# Patient Record
Sex: Female | Born: 1950
Health system: Southern US, Community
[De-identification: ages and names within clinical notes are randomized; demographics above are authoritative.]

## PROBLEM LIST (undated history)

## (undated) DIAGNOSIS — F32A Depression, unspecified: Secondary | ICD-10-CM

## (undated) DIAGNOSIS — R4586 Emotional lability: Secondary | ICD-10-CM

## (undated) DIAGNOSIS — I10 Essential (primary) hypertension: Secondary | ICD-10-CM

## (undated) DIAGNOSIS — L309 Dermatitis, unspecified: Secondary | ICD-10-CM

## (undated) DIAGNOSIS — E789 Disorder of lipoprotein metabolism, unspecified: Secondary | ICD-10-CM

## (undated) DIAGNOSIS — F419 Anxiety disorder, unspecified: Secondary | ICD-10-CM

## (undated) HISTORY — DX: Anxiety disorder, unspecified: F41.9

## (undated) HISTORY — DX: Depression, unspecified: F32.A

## (undated) HISTORY — DX: Dermatitis, unspecified: L30.9

## (undated) HISTORY — DX: Essential (primary) hypertension: I10

## (undated) HISTORY — DX: Emotional lability: R45.86

## (undated) HISTORY — DX: Disorder of lipoprotein metabolism, unspecified: E78.9

---

## 1979-12-28 HISTORY — PX: BREAST EXCISIONAL BIOPSY: SUR124

## 1980-12-27 DIAGNOSIS — N6009 Solitary cyst of unspecified breast: Secondary | ICD-10-CM

## 1980-12-27 HISTORY — DX: Solitary cyst of unspecified breast: N60.09

## 1998-04-17 ENCOUNTER — Encounter: Admission: RE | Admit: 1998-04-17 | Discharge: 1998-04-17 | Payer: Self-pay | Admitting: Family Medicine

## 1998-06-10 ENCOUNTER — Encounter: Admission: RE | Admit: 1998-06-10 | Discharge: 1998-06-10 | Payer: Self-pay | Admitting: Sports Medicine

## 1998-06-27 ENCOUNTER — Encounter: Admission: RE | Admit: 1998-06-27 | Discharge: 1998-06-27 | Payer: Self-pay | Admitting: Family Medicine

## 1999-03-31 ENCOUNTER — Encounter: Admission: RE | Admit: 1999-03-31 | Discharge: 1999-03-31 | Payer: Self-pay | Admitting: Family Medicine

## 1999-10-06 ENCOUNTER — Other Ambulatory Visit: Admission: RE | Admit: 1999-10-06 | Discharge: 1999-10-06 | Payer: Self-pay | Admitting: *Deleted

## 1999-10-20 ENCOUNTER — Encounter: Admission: RE | Admit: 1999-10-20 | Discharge: 1999-10-20 | Payer: Self-pay | Admitting: *Deleted

## 1999-10-20 ENCOUNTER — Encounter: Payer: Self-pay | Admitting: *Deleted

## 2000-08-08 ENCOUNTER — Encounter: Admission: RE | Admit: 2000-08-08 | Discharge: 2000-08-08 | Payer: Self-pay | Admitting: Family Medicine

## 2000-08-09 ENCOUNTER — Encounter: Admission: RE | Admit: 2000-08-09 | Discharge: 2000-08-09 | Payer: Self-pay | Admitting: Family Medicine

## 2000-08-09 ENCOUNTER — Encounter: Payer: Self-pay | Admitting: Family Medicine

## 2000-10-25 ENCOUNTER — Encounter: Admission: RE | Admit: 2000-10-25 | Discharge: 2000-10-25 | Payer: Self-pay | Admitting: *Deleted

## 2000-10-25 ENCOUNTER — Encounter: Payer: Self-pay | Admitting: *Deleted

## 2000-11-02 ENCOUNTER — Other Ambulatory Visit: Admission: RE | Admit: 2000-11-02 | Discharge: 2000-11-02 | Payer: Self-pay | Admitting: *Deleted

## 2000-12-13 ENCOUNTER — Ambulatory Visit (HOSPITAL_COMMUNITY): Admission: RE | Admit: 2000-12-13 | Discharge: 2000-12-13 | Payer: Self-pay | Admitting: Gastroenterology

## 2001-02-14 ENCOUNTER — Encounter: Admission: RE | Admit: 2001-02-14 | Discharge: 2001-02-14 | Payer: Self-pay | Admitting: Gastroenterology

## 2001-02-14 ENCOUNTER — Encounter: Payer: Self-pay | Admitting: Gastroenterology

## 2001-10-26 ENCOUNTER — Encounter: Payer: Self-pay | Admitting: *Deleted

## 2001-10-26 ENCOUNTER — Encounter: Admission: RE | Admit: 2001-10-26 | Discharge: 2001-10-26 | Payer: Self-pay | Admitting: *Deleted

## 2002-10-30 ENCOUNTER — Encounter: Admission: RE | Admit: 2002-10-30 | Discharge: 2002-10-30 | Payer: Self-pay | Admitting: Unknown Physician Specialty

## 2002-10-30 ENCOUNTER — Encounter: Payer: Self-pay | Admitting: Unknown Physician Specialty

## 2003-10-14 ENCOUNTER — Encounter: Payer: Self-pay | Admitting: Family Medicine

## 2003-10-14 ENCOUNTER — Encounter: Admission: RE | Admit: 2003-10-14 | Discharge: 2003-10-14 | Payer: Self-pay | Admitting: Family Medicine

## 2003-11-14 ENCOUNTER — Encounter: Admission: RE | Admit: 2003-11-14 | Discharge: 2003-11-14 | Payer: Self-pay | Admitting: Family Medicine

## 2004-05-14 ENCOUNTER — Encounter: Admission: RE | Admit: 2004-05-14 | Discharge: 2004-05-14 | Payer: Self-pay | Admitting: Family Medicine

## 2004-08-24 ENCOUNTER — Encounter: Admission: RE | Admit: 2004-08-24 | Discharge: 2004-08-24 | Payer: Self-pay | Admitting: Family Medicine

## 2004-11-18 ENCOUNTER — Encounter: Admission: RE | Admit: 2004-11-18 | Discharge: 2004-11-18 | Payer: Self-pay | Admitting: Family Medicine

## 2005-12-23 ENCOUNTER — Encounter: Admission: RE | Admit: 2005-12-23 | Discharge: 2005-12-23 | Payer: Self-pay | Admitting: Family Medicine

## 2005-12-27 HISTORY — PX: CARPAL TUNNEL RELEASE: SHX101

## 2006-01-12 ENCOUNTER — Other Ambulatory Visit: Admission: RE | Admit: 2006-01-12 | Discharge: 2006-01-12 | Payer: Self-pay | Admitting: *Deleted

## 2006-06-17 ENCOUNTER — Ambulatory Visit (HOSPITAL_BASED_OUTPATIENT_CLINIC_OR_DEPARTMENT_OTHER): Admission: RE | Admit: 2006-06-17 | Discharge: 2006-06-17 | Payer: Self-pay | Admitting: Orthopedic Surgery

## 2007-01-26 ENCOUNTER — Encounter: Admission: RE | Admit: 2007-01-26 | Discharge: 2007-01-26 | Payer: Self-pay | Admitting: Obstetrics and Gynecology

## 2007-02-21 ENCOUNTER — Other Ambulatory Visit: Admission: RE | Admit: 2007-02-21 | Discharge: 2007-02-21 | Payer: Self-pay | Admitting: Obstetrics and Gynecology

## 2008-02-21 ENCOUNTER — Other Ambulatory Visit: Admission: RE | Admit: 2008-02-21 | Discharge: 2008-02-21 | Payer: Self-pay | Admitting: Obstetrics and Gynecology

## 2008-02-21 ENCOUNTER — Encounter: Admission: RE | Admit: 2008-02-21 | Discharge: 2008-02-21 | Payer: Self-pay | Admitting: Obstetrics and Gynecology

## 2008-06-20 ENCOUNTER — Ambulatory Visit (HOSPITAL_COMMUNITY): Admission: RE | Admit: 2008-06-20 | Discharge: 2008-06-20 | Payer: Self-pay | Admitting: Obstetrics and Gynecology

## 2008-06-20 ENCOUNTER — Encounter (INDEPENDENT_AMBULATORY_CARE_PROVIDER_SITE_OTHER): Payer: Self-pay | Admitting: Obstetrics and Gynecology

## 2009-02-21 ENCOUNTER — Encounter: Admission: RE | Admit: 2009-02-21 | Discharge: 2009-02-21 | Payer: Self-pay | Admitting: Obstetrics and Gynecology

## 2009-08-20 ENCOUNTER — Other Ambulatory Visit: Admission: RE | Admit: 2009-08-20 | Discharge: 2009-08-20 | Payer: Self-pay | Admitting: Obstetrics and Gynecology

## 2011-01-13 ENCOUNTER — Encounter
Admission: RE | Admit: 2011-01-13 | Discharge: 2011-01-13 | Payer: Self-pay | Source: Home / Self Care | Attending: Obstetrics and Gynecology | Admitting: Obstetrics and Gynecology

## 2011-01-26 ENCOUNTER — Other Ambulatory Visit (HOSPITAL_COMMUNITY)
Admission: RE | Admit: 2011-01-26 | Discharge: 2011-01-26 | Disposition: A | Discharge status: Home / Self Care | Payer: 59 | Source: Ambulatory Visit | Attending: Obstetrics and Gynecology | Admitting: Obstetrics and Gynecology

## 2011-01-26 ENCOUNTER — Other Ambulatory Visit: Payer: Self-pay | Admitting: Obstetrics and Gynecology

## 2011-01-26 DIAGNOSIS — Z1159 Encounter for screening for other viral diseases: Secondary | ICD-10-CM | POA: Insufficient documentation

## 2011-01-26 DIAGNOSIS — Z01419 Encounter for gynecological examination (general) (routine) without abnormal findings: Secondary | ICD-10-CM | POA: Insufficient documentation

## 2011-05-11 NOTE — Op Note (Signed)
[name], [name]               ACCOUNT NO.:  [id number]   MEDICAL RECORD NO.:  [id number]          PATIENT TYPE:  AMB   LOCATION:  SDC                           FACILITY:  [facility]   PHYSICIAN:  Charles A. Delcambre, MDDATE OF BIRTH:  January 21, 1951   DATE OF PROCEDURE:  06/20/2008  DATE OF DISCHARGE:                               OPERATIVE REPORT   PREOPERATIVE DIAGNOSIS:  Postmenopausal bleeding.   POSTOPERATIVE DIAGNOSIS:  Postmenopausal bleeding.   PROCEDURE:  Hysteroscopy, D&C, and paracervical block.   ASSISTANT:  None.   COMPLICATIONS:  None.   BLOOD LOSS:  Less than 10 mL.   FINDINGS:  Some proliferative areas and otherwise atrophic appearing  endometrial cavity.   SPECIMEN:  Endometrial curettings to pathology.   INSTRUMENT COUNT:  Sponge, lap, and needle count correct x2.   DESCRIPTION OF PROCEDURE:  The patient was taken to the operating room,  placed supine position.  General anesthetic was induced without  difficulty.  She was then placed in dorsal lithotomy position in Redan  universal stirrups.  This speculum was placed anterior lip of the  cervix, was grasped with a single-tooth tenaculum.  After prep was  undertaken and completed Hanks dilators were used to dilate enough to  pass a sound, the sound was 7 cm.  A 5 mm hysteroscope was placed and  operative findings were noted above.  Generalized curettings were taken  and small amount of tissue was obtained.  There was no evidence of  perforation.  The fluid loss was less than 50 mL.  The patient was  awakened.  Tenaculum was removed.  Hemostasis was excellent.  She was  taken to recovery with physician in attendance, having tolerated the  procedure well.      Charles A. Sydnee Cabal, MD  Electronically Signed     CAD/MEDQ  D:  06/20/2008  T:  06/21/2008  Job:  [id number]

## 2011-05-11 NOTE — H&P (Signed)
[name], [name]               ACCOUNT NO.:  [id number]   MEDICAL RECORD NO.:  [id number]          PATIENT TYPE:  AMB   LOCATION:  SDC                           [facility]  [facility]   PHYSICIAN:  Charles A. Delcambre, MDDATE OF BIRTH:  31-Aug-1951   DATE OF ADMISSION:  DATE OF DISCHARGE:                              HISTORY & PHYSICAL   She is to be admitted to undergo hysteroscopy and D&C for postmenopausal  bleeding.  Endometrial biopsy was benign but inadequate; it was benign  by lower cervical cells but no endometrium found.  Pap smear was  negative on February 21, 2008, and transvaginal ultrasound did show  endometrial thickness of 8.8 mm, and she is to be taken to the OR for  hysteroscopy and D&C secondary to thickened endometrium in  postmenopausal woman with postmenopausal bleeding.   PAST MEDICAL HISTORY:  1. Hypertension.  2. Hair loss.  3. Depression.   SURGICAL HISTORY:  Left breast cyst, benign.   MEDICATIONS:  1. Labetalol 300 mg b.i.d.  2. Spironolactone 25 once a day.  3. Finasteride 5 mg one-half tablets daily.  4. Savella 100 mg daily.   ALLERGIES:  No known drug allergies.   SOCIAL HISTORY:  She does smoke three-quarters pack of cigarette  smoking, 2 beers per day alcohol.  No drug use.  She is divorced.  Not  sexually active currently.   FAMILY HISTORY:  Father deceased at age 2 with emphysema.  Mother  deceased at 56 with hypertension and diabetes.  Strong family history,  otherwise of hypertension and diabetes.   REVIEW OF SYSTEMS:  Negative full review.   PHYSICAL EXAMINATION:  GENERAL:  Alert and oriented x3, in no distress.  VITAL SIGNS:  Blood pressure 140/80; respirations 20; and pulse 80,  afebrile.  CORONARY:  Regular rate and rhythm.  LUNGS:  Clear bilaterally.  ABDOMEN:  Soft, flat, and nontender.  No masses palpable.  PELVIC:  Normal external female genitalia.  Bartholin, urethra, Skene  within normal limits.  Vault without discharge or  lesions.  Multiparous  appearing cervix.  Minimal prolapse.  No significant cystocele or  rectocele.   ASSESSMENT:  Postmenopausal bleeding 627.1.   PLAN:  Hysteroscopy D&C.  She accepts risks of infection, bleeding,  bowel and bladder damage, ureteral damage, blood product risk of  hepatitis C, HIV exposure, and perforation risk.  All questions were  answered and we will proceed as outlined, NPO past midnight for 6-8  hours before procedure.  Take morning medications with sip of water at  0600.  She accepts risks of perforation as well.  We will proceed as  outlined.      Charles A. Sydnee Cabal, MD  Electronically Signed     CAD/MEDQ  D:  06/12/2008  T:  06/13/2008  Job:  [id number]

## 2011-05-14 NOTE — Op Note (Signed)
[name]               ACCOUNT NO.:  [id number]   MEDICAL RECORD NO.:  [id number]          PATIENT TYPE:  AMB   LOCATION:  DSC                          FACILITY:  [facility]   PHYSICIAN:  Katy Fitch. Sypher, M.D. DATE OF BIRTH:  July 21, 1951   DATE OF PROCEDURE:  06/17/2006  DATE OF DISCHARGE:                                 OPERATIVE REPORT   PREOPERATIVE DIAGNOSIS:  Bilateral carpal tunnel syndrome.   POSTOPERATIVE DIAGNOSIS:  Bilateral carpal tunnel syndrome.   OPERATION:  1.  Release of left transverse carpal ligament.  2.  Injection of right ulnar bursa with Depo-Medrol and lidocaine.   SURGEON:  Dr. Josephine Igo.   ASSISTANT:  Annye Rusk PA-C.   ANESTHESIA:  General by LMA, supervising anesthesiologist is Dr. Jean Rosenthal.   INDICATIONS:  [name] is a 60 year old woman referred through the  courtesy of Dr. Renaye Rakers for evaluation and management of bilateral hand  numbness and discomfort.   Clinical examination suggested carpal tunnel syndrome.   Electrodiagnostic studies completed by Dr. Johna Roles revealed left greater  than right carpal tunnel syndrome.   We advised proceeding with release of the left transverse carpal ligament  and injection of the right ulnar bursa under anesthesia.   After informed consent, [name] is brought to the operating room at this  time.   PROCEDURE:  [name] was brought to the operating room and placed in  supine position on the operating table.   Following the induction of general anesthesia by LMA technique, the left arm  was prepped with Betadine soap solution and sterilely draped.  Following  exsanguination of the limb with Esmarch bandage,  arterial tourniquet was  inflated to 220 mmHg.  The procedure commenced with a short incision in the  line of the ring finger and the palm. The subcutaneous tissue was carefully  divided via the palmar fascia.  This was split longitudinally through the  __________ branch  of the median nerve.   These were followed back to the transverse carpal ligament which was gently  isolated from the median nerve.  The ligament was separated from median  nerve with a Penfield 4 elevator followed by release of the ligament with  scissors extending into the distal forearm.  This widely opened the carpal  canal.  No mass or other predicaments were noted.   Bleeding points along the margin of the released ligament were  electrocauterized with bipolar current.   A compressive dressing was applied with a volar plaster splint maintaining  the wrist in 5 degrees of dorsiflexion. The tourniquet was released with  immediate capillary refill to the fingers and thumb.   Attention then was directed to the right hand.  The wrist was prepped with  Betadine followed by placement of the fingers in flexion.  A 27 gauge needle  and 3 mL syringe was used to inject a 1.5 mL mixture of three quarter mL of  Depo-Medrol 40 mg/mL and 1% plain lidocaine.   The injection was uncomplicated.  Aspiration was accomplished prior to  injecting the solution and the fingers were extended  to prevent possible  tendon injection.   There were no apparent complications.   The wrist was cleaned and dressed with a Band-Aid.   [name] tolerated the surgery and anesthesia well.  She was awakened from  her general anesthesia and transferred to the recovery room with stable  vital signs.   She will be discharged to the care of her family with a prescription for  Percocet 5 mg 1 tablet p.o. q. 4-6h p.r.n. pain, 20 tablets without refill.      Katy Fitch Sypher, M.D.  Electronically Signed     RVS/MEDQ  D:  06/17/2006  T:  06/17/2006  Job:  [id number]

## 2011-05-14 NOTE — Procedures (Signed)
[facility]  Patient:    [name], [name]                      MRN: [id number] Proc. Date: 12/13/00 Adm. Date:  [id number] Attending:  Louie Bun CC:         Geraldo Pitter, M.D.   Procedure Report  PROCEDURE:  Esophagogastroduodenoscopy.  INDICATION FOR PROCEDURE:  Recent onset of gastroesophageal reflux symptoms in a 60 year old patient without complete response to Prevacid.  DESCRIPTION OF PROCEDURE:  The patient was placed in the left lateral decubitus position and placed on the pulse monitor with continuous low-flow oxygen delivered by nasal cannula.  She was sedated with 60 mg IV Demerol and 6 mg IV Versed.  The Olympus video endoscope was advanced under direct vision into the oropharynx and esophagus.  The esophagus was straight and of normal caliber.  The squamocolumnar line at 38 cm.  There was no visible hiatal hernia, ring, stricture, esophagitis, or other abnormality at the GE junction or the distal esophagus.  The stomach was entered, and a small amount of liquid secretions were suctioned from the fundus.  Retroflex view of the cardia was unremarkable.  The fundus and body appeared normal.  The antrum showed streaks of erythema along the greater curvature consistent with mild antral gastritis.  A CLOtest was obtained.  The duodenum was entered, and both the bulb and the second portion were well inspected and appeared to be within normal limits.  The pylorus was nondeformed and free of ulceration or inflammation.  The scope was then withdrawn and the patient returned to the recovery room in stable condition.  She tolerated the procedure well, and there were no immediate complications.  IMPRESSION: 1. Antral gastritis. 2. Otherwise normal endoscopy.  PLAN:  Await CLOtest test and treat for eradication if Helicobacter positive. Consider gallbladder ultrasound if symptoms persist and not responsive to medical therapy. DD:   12/13/00 TD:  12/14/00 Job: [id number]

## 2011-09-23 LAB — COMPREHENSIVE METABOLIC PANEL
ALT: 27
AST: 24
Albumin: 4.2
Alkaline Phosphatase: 43
BUN: 10
CO2: 28
Calcium: 9.6
Chloride: 103
Creatinine, Ser: 0.85
GFR calc Af Amer: >60
GFR calc non Af Amer: >60
Glucose, Bld: 87
Potassium: 4.9
Sodium: 137
Total Bilirubin: 1
Total Protein: 6.8

## 2011-09-23 LAB — CBC
HCT: 43.4
Hemoglobin: 14.7
MCHC: 33.9
MCV: 98.2
Platelets: 237
RBC: 4.42
RDW: 13.2
WBC: 7.7

## 2011-12-16 ENCOUNTER — Other Ambulatory Visit: Payer: Self-pay | Admitting: Obstetrics and Gynecology

## 2011-12-16 DIAGNOSIS — Z1231 Encounter for screening mammogram for malignant neoplasm of breast: Secondary | ICD-10-CM

## 2012-01-18 ENCOUNTER — Ambulatory Visit
Admission: RE | Admit: 2012-01-18 | Discharge: 2012-01-18 | Disposition: A | Discharge status: Home / Self Care | Payer: 59 | Source: Ambulatory Visit | Attending: Obstetrics and Gynecology | Admitting: Obstetrics and Gynecology

## 2012-01-18 DIAGNOSIS — Z1231 Encounter for screening mammogram for malignant neoplasm of breast: Secondary | ICD-10-CM

## 2012-01-27 ENCOUNTER — Other Ambulatory Visit (HOSPITAL_COMMUNITY)
Admission: RE | Admit: 2012-01-27 | Discharge: 2012-01-27 | Disposition: A | Discharge status: Home / Self Care | Payer: 59 | Source: Ambulatory Visit | Attending: Obstetrics and Gynecology | Admitting: Obstetrics and Gynecology

## 2012-01-27 ENCOUNTER — Other Ambulatory Visit: Payer: Self-pay | Admitting: Obstetrics and Gynecology

## 2012-01-27 DIAGNOSIS — Z01419 Encounter for gynecological examination (general) (routine) without abnormal findings: Secondary | ICD-10-CM | POA: Insufficient documentation

## 2013-01-29 ENCOUNTER — Other Ambulatory Visit: Payer: Self-pay | Admitting: Obstetrics and Gynecology

## 2013-01-29 DIAGNOSIS — Z1231 Encounter for screening mammogram for malignant neoplasm of breast: Secondary | ICD-10-CM

## 2013-02-26 ENCOUNTER — Ambulatory Visit: Payer: 59

## 2013-02-28 ENCOUNTER — Ambulatory Visit
Admission: RE | Admit: 2013-02-28 | Discharge: 2013-02-28 | Disposition: A | Discharge status: Home / Self Care | Payer: BC Managed Care – PPO | Source: Ambulatory Visit | Attending: Obstetrics and Gynecology | Admitting: Obstetrics and Gynecology

## 2013-02-28 DIAGNOSIS — Z1231 Encounter for screening mammogram for malignant neoplasm of breast: Secondary | ICD-10-CM

## 2013-03-05 ENCOUNTER — Other Ambulatory Visit: Payer: Self-pay | Admitting: Obstetrics and Gynecology

## 2013-03-05 ENCOUNTER — Other Ambulatory Visit (HOSPITAL_COMMUNITY)
Admission: RE | Admit: 2013-03-05 | Discharge: 2013-03-05 | Disposition: A | Discharge status: Home / Self Care | Payer: BC Managed Care – PPO | Source: Ambulatory Visit | Attending: Obstetrics and Gynecology | Admitting: Obstetrics and Gynecology

## 2013-03-05 DIAGNOSIS — Z01419 Encounter for gynecological examination (general) (routine) without abnormal findings: Secondary | ICD-10-CM | POA: Insufficient documentation

## 2013-03-05 DIAGNOSIS — Z1151 Encounter for screening for human papillomavirus (HPV): Secondary | ICD-10-CM | POA: Insufficient documentation

## 2013-03-13 ENCOUNTER — Ambulatory Visit
Admission: RE | Admit: 2013-03-13 | Discharge: 2013-03-13 | Disposition: A | Discharge status: Home / Self Care | Payer: BC Managed Care – PPO | Source: Ambulatory Visit | Attending: Obstetrics and Gynecology | Admitting: Obstetrics and Gynecology

## 2014-03-06 ENCOUNTER — Other Ambulatory Visit (HOSPITAL_COMMUNITY)
Admission: RE | Admit: 2014-03-06 | Discharge: 2014-03-06 | Disposition: A | Discharge status: Home / Self Care | Payer: No Typology Code available for payment source | Source: Ambulatory Visit | Attending: Obstetrics and Gynecology | Admitting: Obstetrics and Gynecology

## 2014-03-06 ENCOUNTER — Other Ambulatory Visit: Payer: Self-pay | Admitting: Obstetrics and Gynecology

## 2014-03-06 DIAGNOSIS — Z01419 Encounter for gynecological examination (general) (routine) without abnormal findings: Secondary | ICD-10-CM | POA: Insufficient documentation

## 2014-03-12 ENCOUNTER — Other Ambulatory Visit: Payer: Self-pay

## 2014-03-12 DIAGNOSIS — Z1231 Encounter for screening mammogram for malignant neoplasm of breast: Secondary | ICD-10-CM

## 2014-03-15 ENCOUNTER — Ambulatory Visit
Admission: RE | Admit: 2014-03-15 | Discharge: 2014-03-15 | Disposition: A | Discharge status: Home / Self Care | Payer: No Typology Code available for payment source | Source: Ambulatory Visit

## 2014-03-15 DIAGNOSIS — Z1231 Encounter for screening mammogram for malignant neoplasm of breast: Secondary | ICD-10-CM

## 2015-01-29 ENCOUNTER — Other Ambulatory Visit: Payer: Self-pay | Admitting: Family Medicine

## 2015-01-29 DIAGNOSIS — R1310 Dysphagia, unspecified: Secondary | ICD-10-CM

## 2015-01-30 ENCOUNTER — Ambulatory Visit
Admission: RE | Admit: 2015-01-30 | Discharge: 2015-01-30 | Disposition: A | Discharge status: Home / Self Care | Payer: BLUE CROSS/BLUE SHIELD | Source: Ambulatory Visit | Attending: Family Medicine | Admitting: Family Medicine

## 2015-01-30 DIAGNOSIS — R1310 Dysphagia, unspecified: Secondary | ICD-10-CM

## 2015-02-28 ENCOUNTER — Other Ambulatory Visit: Payer: Self-pay

## 2015-02-28 DIAGNOSIS — Z1231 Encounter for screening mammogram for malignant neoplasm of breast: Secondary | ICD-10-CM

## 2015-03-17 ENCOUNTER — Other Ambulatory Visit: Payer: Self-pay

## 2015-03-17 ENCOUNTER — Ambulatory Visit
Admission: RE | Admit: 2015-03-17 | Discharge: 2015-03-17 | Disposition: A | Discharge status: Home / Self Care | Payer: BLUE CROSS/BLUE SHIELD | Source: Ambulatory Visit

## 2015-03-17 DIAGNOSIS — Z1231 Encounter for screening mammogram for malignant neoplasm of breast: Secondary | ICD-10-CM

## 2015-03-18 ENCOUNTER — Other Ambulatory Visit (HOSPITAL_COMMUNITY)
Admission: RE | Admit: 2015-03-18 | Discharge: 2015-03-18 | Disposition: A | Discharge status: Home / Self Care | Payer: BLUE CROSS/BLUE SHIELD | Source: Ambulatory Visit | Attending: Obstetrics and Gynecology | Admitting: Obstetrics and Gynecology

## 2015-03-18 ENCOUNTER — Other Ambulatory Visit: Payer: Self-pay | Admitting: Obstetrics and Gynecology

## 2015-03-18 DIAGNOSIS — Z01419 Encounter for gynecological examination (general) (routine) without abnormal findings: Secondary | ICD-10-CM | POA: Diagnosis present

## 2015-03-19 LAB — CYTOLOGY - PAP

## 2016-02-03 DIAGNOSIS — F331 Major depressive disorder, recurrent, moderate: Secondary | ICD-10-CM | POA: Diagnosis not present

## 2016-02-10 ENCOUNTER — Other Ambulatory Visit: Payer: Self-pay

## 2016-02-10 DIAGNOSIS — Z1231 Encounter for screening mammogram for malignant neoplasm of breast: Secondary | ICD-10-CM

## 2016-02-11 ENCOUNTER — Other Ambulatory Visit: Payer: Self-pay | Admitting: Family Medicine

## 2016-02-11 DIAGNOSIS — R0600 Dyspnea, unspecified: Secondary | ICD-10-CM

## 2016-02-12 ENCOUNTER — Ambulatory Visit (INDEPENDENT_AMBULATORY_CARE_PROVIDER_SITE_OTHER): Payer: Medicare Other | Admitting: Internal Medicine

## 2016-02-12 DIAGNOSIS — R0600 Dyspnea, unspecified: Secondary | ICD-10-CM

## 2016-02-12 LAB — PULMONARY FUNCTION TEST
DL/VA % PRED: 84 %
DL/VA: 3.96 ml/min/mmHg/L
DLCO COR % PRED: 65 %
DLCO UNC % PRED: 65 %
DLCO UNC: 14.91 ml/min/mmHg
DLCO cor: 14.95 ml/min/mmHg
FEF 25-75 POST: 1.07 L/s
FEF 25-75 PRE: 1.02 L/s
FEF2575-%CHANGE-POST: 5 %
FEF2575-%PRED-POST: 59 %
FEF2575-%Pred-Pre: 56 %
FEV1-%CHANGE-POST: 1 %
FEV1-%PRED-POST: 92 %
FEV1-%Pred-Pre: 91 %
FEV1-PRE: 1.72 L
FEV1-Post: 1.74 L
FEV1FVC-%CHANGE-POST: 1 %
FEV1FVC-%PRED-PRE: 89 %
FEV6-%Change-Post: 0 %
FEV6-%PRED-POST: 104 %
FEV6-%Pred-Pre: 103 %
FEV6-PRE: 2.41 L
FEV6-Post: 2.42 L
FEV6FVC-%CHANGE-POST: 0 %
FEV6FVC-%PRED-PRE: 102 %
FEV6FVC-%Pred-Post: 103 %
FVC-%Change-Post: 0 %
FVC-%Pred-Post: 101 %
FVC-%Pred-Pre: 101 %
FVC-Post: 2.44 L
FVC-Pre: 2.45 L
POST FEV1/FVC RATIO: 71 %
PRE FEV6/FVC RATIO: 98 %
Post FEV6/FVC ratio: 99 %
Pre FEV1/FVC ratio: 70 %
RV % PRED: 105 %
RV: 2.15 L
TLC % pred: 94 %
TLC: 4.64 L

## 2016-02-12 NOTE — Progress Notes (Signed)
PFT done today. 

## 2016-03-17 ENCOUNTER — Ambulatory Visit
Admission: RE | Admit: 2016-03-17 | Discharge: 2016-03-17 | Disposition: A | Discharge status: Home / Self Care | Payer: Medicare Other | Source: Ambulatory Visit

## 2016-03-17 DIAGNOSIS — Z1231 Encounter for screening mammogram for malignant neoplasm of breast: Secondary | ICD-10-CM | POA: Diagnosis not present

## 2016-03-22 DIAGNOSIS — F331 Major depressive disorder, recurrent, moderate: Secondary | ICD-10-CM | POA: Diagnosis not present

## 2016-03-23 ENCOUNTER — Other Ambulatory Visit: Payer: Self-pay | Admitting: Obstetrics and Gynecology

## 2016-03-23 ENCOUNTER — Other Ambulatory Visit (HOSPITAL_COMMUNITY)
Admission: RE | Admit: 2016-03-23 | Discharge: 2016-03-23 | Disposition: A | Discharge status: Home / Self Care | Payer: Medicare Other | Source: Ambulatory Visit | Attending: Obstetrics and Gynecology | Admitting: Obstetrics and Gynecology

## 2016-03-23 DIAGNOSIS — Z1151 Encounter for screening for human papillomavirus (HPV): Secondary | ICD-10-CM | POA: Insufficient documentation

## 2016-03-23 DIAGNOSIS — Z01419 Encounter for gynecological examination (general) (routine) without abnormal findings: Secondary | ICD-10-CM | POA: Insufficient documentation

## 2016-03-24 LAB — CYTOLOGY - PAP

## 2016-05-17 DIAGNOSIS — R7309 Other abnormal glucose: Secondary | ICD-10-CM | POA: Diagnosis not present

## 2016-05-17 DIAGNOSIS — F17290 Nicotine dependence, other tobacco product, uncomplicated: Secondary | ICD-10-CM | POA: Diagnosis not present

## 2016-05-17 DIAGNOSIS — I10 Essential (primary) hypertension: Secondary | ICD-10-CM | POA: Diagnosis not present

## 2016-05-20 DIAGNOSIS — I10 Essential (primary) hypertension: Secondary | ICD-10-CM | POA: Diagnosis not present

## 2016-05-20 DIAGNOSIS — Z23 Encounter for immunization: Secondary | ICD-10-CM | POA: Diagnosis not present

## 2016-05-20 DIAGNOSIS — F5111 Primary hypersomnia: Secondary | ICD-10-CM | POA: Diagnosis not present

## 2016-06-16 DIAGNOSIS — F331 Major depressive disorder, recurrent, moderate: Secondary | ICD-10-CM | POA: Diagnosis not present

## 2016-07-05 DIAGNOSIS — K219 Gastro-esophageal reflux disease without esophagitis: Secondary | ICD-10-CM | POA: Diagnosis not present

## 2016-07-05 DIAGNOSIS — I10 Essential (primary) hypertension: Secondary | ICD-10-CM | POA: Diagnosis not present

## 2016-07-05 DIAGNOSIS — Z6821 Body mass index (BMI) 21.0-21.9, adult: Secondary | ICD-10-CM | POA: Diagnosis not present

## 2016-07-05 DIAGNOSIS — R7309 Other abnormal glucose: Secondary | ICD-10-CM | POA: Diagnosis not present

## 2016-07-19 DIAGNOSIS — S92514A Nondisplaced fracture of proximal phalanx of right lesser toe(s), initial encounter for closed fracture: Secondary | ICD-10-CM | POA: Diagnosis not present

## 2016-07-19 DIAGNOSIS — M25774 Osteophyte, right foot: Secondary | ICD-10-CM | POA: Diagnosis not present

## 2016-08-13 DIAGNOSIS — M25774 Osteophyte, right foot: Secondary | ICD-10-CM | POA: Diagnosis not present

## 2016-08-13 DIAGNOSIS — S92911D Unspecified fracture of right toe(s), subsequent encounter for fracture with routine healing: Secondary | ICD-10-CM | POA: Diagnosis not present

## 2016-09-01 DIAGNOSIS — S92911D Unspecified fracture of right toe(s), subsequent encounter for fracture with routine healing: Secondary | ICD-10-CM | POA: Diagnosis not present

## 2016-09-01 DIAGNOSIS — M25774 Osteophyte, right foot: Secondary | ICD-10-CM | POA: Diagnosis not present

## 2016-09-07 DIAGNOSIS — F332 Major depressive disorder, recurrent severe without psychotic features: Secondary | ICD-10-CM | POA: Diagnosis not present

## 2016-09-10 DIAGNOSIS — Z23 Encounter for immunization: Secondary | ICD-10-CM | POA: Diagnosis not present

## 2016-10-06 DIAGNOSIS — H524 Presbyopia: Secondary | ICD-10-CM | POA: Diagnosis not present

## 2016-10-06 DIAGNOSIS — H5203 Hypermetropia, bilateral: Secondary | ICD-10-CM | POA: Diagnosis not present

## 2016-10-21 DIAGNOSIS — F332 Major depressive disorder, recurrent severe without psychotic features: Secondary | ICD-10-CM | POA: Diagnosis not present

## 2016-12-15 DIAGNOSIS — F332 Major depressive disorder, recurrent severe without psychotic features: Secondary | ICD-10-CM | POA: Diagnosis not present

## 2017-01-19 DIAGNOSIS — F5111 Primary hypersomnia: Secondary | ICD-10-CM | POA: Diagnosis not present

## 2017-01-19 DIAGNOSIS — R7309 Other abnormal glucose: Secondary | ICD-10-CM | POA: Diagnosis not present

## 2017-01-19 DIAGNOSIS — I10 Essential (primary) hypertension: Secondary | ICD-10-CM | POA: Diagnosis not present

## 2017-01-25 DIAGNOSIS — F332 Major depressive disorder, recurrent severe without psychotic features: Secondary | ICD-10-CM | POA: Diagnosis not present

## 2017-02-08 ENCOUNTER — Other Ambulatory Visit: Payer: Self-pay | Admitting: Obstetrics and Gynecology

## 2017-02-08 DIAGNOSIS — Z1231 Encounter for screening mammogram for malignant neoplasm of breast: Secondary | ICD-10-CM

## 2017-03-03 DIAGNOSIS — R7309 Other abnormal glucose: Secondary | ICD-10-CM | POA: Diagnosis not present

## 2017-03-03 DIAGNOSIS — F5111 Primary hypersomnia: Secondary | ICD-10-CM | POA: Diagnosis not present

## 2017-03-03 DIAGNOSIS — I10 Essential (primary) hypertension: Secondary | ICD-10-CM | POA: Diagnosis not present

## 2017-03-15 DIAGNOSIS — F332 Major depressive disorder, recurrent severe without psychotic features: Secondary | ICD-10-CM | POA: Diagnosis not present

## 2017-03-18 ENCOUNTER — Ambulatory Visit
Admission: RE | Admit: 2017-03-18 | Discharge: 2017-03-18 | Disposition: A | Discharge status: Home / Self Care | Payer: Medicare Other | Source: Ambulatory Visit | Attending: Obstetrics and Gynecology | Admitting: Obstetrics and Gynecology

## 2017-03-18 DIAGNOSIS — Z1231 Encounter for screening mammogram for malignant neoplasm of breast: Secondary | ICD-10-CM | POA: Diagnosis not present

## 2017-03-29 DIAGNOSIS — N941 Unspecified dyspareunia: Secondary | ICD-10-CM | POA: Diagnosis not present

## 2017-03-29 DIAGNOSIS — Z7251 High risk heterosexual behavior: Secondary | ICD-10-CM | POA: Diagnosis not present

## 2017-03-29 DIAGNOSIS — N952 Postmenopausal atrophic vaginitis: Secondary | ICD-10-CM | POA: Diagnosis not present

## 2017-03-29 DIAGNOSIS — Z9189 Other specified personal risk factors, not elsewhere classified: Secondary | ICD-10-CM | POA: Diagnosis not present

## 2017-06-14 DIAGNOSIS — F332 Major depressive disorder, recurrent severe without psychotic features: Secondary | ICD-10-CM | POA: Diagnosis not present

## 2017-07-05 DIAGNOSIS — R7309 Other abnormal glucose: Secondary | ICD-10-CM | POA: Diagnosis not present

## 2017-07-05 DIAGNOSIS — I10 Essential (primary) hypertension: Secondary | ICD-10-CM | POA: Diagnosis not present

## 2017-07-05 DIAGNOSIS — F17290 Nicotine dependence, other tobacco product, uncomplicated: Secondary | ICD-10-CM | POA: Diagnosis not present

## 2017-07-05 DIAGNOSIS — F339 Major depressive disorder, recurrent, unspecified: Secondary | ICD-10-CM | POA: Diagnosis not present

## 2017-07-12 DIAGNOSIS — H52203 Unspecified astigmatism, bilateral: Secondary | ICD-10-CM | POA: Diagnosis not present

## 2017-07-12 DIAGNOSIS — H5203 Hypermetropia, bilateral: Secondary | ICD-10-CM | POA: Diagnosis not present

## 2017-07-12 DIAGNOSIS — H25013 Cortical age-related cataract, bilateral: Secondary | ICD-10-CM | POA: Diagnosis not present

## 2017-07-12 DIAGNOSIS — H2513 Age-related nuclear cataract, bilateral: Secondary | ICD-10-CM | POA: Diagnosis not present

## 2017-07-13 DIAGNOSIS — F332 Major depressive disorder, recurrent severe without psychotic features: Secondary | ICD-10-CM | POA: Diagnosis not present

## 2017-08-16 DIAGNOSIS — Z1211 Encounter for screening for malignant neoplasm of colon: Secondary | ICD-10-CM | POA: Diagnosis not present

## 2017-08-16 DIAGNOSIS — Z86010 Personal history of colonic polyps: Secondary | ICD-10-CM | POA: Diagnosis not present

## 2017-09-07 DIAGNOSIS — F332 Major depressive disorder, recurrent severe without psychotic features: Secondary | ICD-10-CM | POA: Diagnosis not present

## 2017-09-14 DIAGNOSIS — Z1211 Encounter for screening for malignant neoplasm of colon: Secondary | ICD-10-CM | POA: Diagnosis not present

## 2017-09-14 DIAGNOSIS — Z86010 Personal history of colonic polyps: Secondary | ICD-10-CM | POA: Diagnosis not present

## 2017-09-14 DIAGNOSIS — K635 Polyp of colon: Secondary | ICD-10-CM | POA: Diagnosis not present

## 2017-09-14 DIAGNOSIS — D123 Benign neoplasm of transverse colon: Secondary | ICD-10-CM | POA: Diagnosis not present

## 2017-09-27 DIAGNOSIS — F332 Major depressive disorder, recurrent severe without psychotic features: Secondary | ICD-10-CM | POA: Diagnosis not present

## 2017-11-03 DIAGNOSIS — F17290 Nicotine dependence, other tobacco product, uncomplicated: Secondary | ICD-10-CM | POA: Diagnosis not present

## 2017-11-03 DIAGNOSIS — F339 Major depressive disorder, recurrent, unspecified: Secondary | ICD-10-CM | POA: Diagnosis not present

## 2017-11-03 DIAGNOSIS — Z23 Encounter for immunization: Secondary | ICD-10-CM | POA: Diagnosis not present

## 2017-11-03 DIAGNOSIS — I10 Essential (primary) hypertension: Secondary | ICD-10-CM | POA: Diagnosis not present

## 2017-11-24 DIAGNOSIS — F332 Major depressive disorder, recurrent severe without psychotic features: Secondary | ICD-10-CM | POA: Diagnosis not present

## 2018-02-17 ENCOUNTER — Other Ambulatory Visit: Payer: Self-pay | Admitting: Obstetrics and Gynecology

## 2018-02-17 DIAGNOSIS — F332 Major depressive disorder, recurrent severe without psychotic features: Secondary | ICD-10-CM | POA: Diagnosis not present

## 2018-02-17 DIAGNOSIS — Z1231 Encounter for screening mammogram for malignant neoplasm of breast: Secondary | ICD-10-CM

## 2018-03-20 ENCOUNTER — Ambulatory Visit
Admission: RE | Admit: 2018-03-20 | Discharge: 2018-03-20 | Disposition: A | Discharge status: Home / Self Care | Payer: Medicare Other | Source: Ambulatory Visit | Attending: Obstetrics and Gynecology | Admitting: Obstetrics and Gynecology

## 2018-03-20 DIAGNOSIS — Z1231 Encounter for screening mammogram for malignant neoplasm of breast: Secondary | ICD-10-CM

## 2018-03-21 DIAGNOSIS — E118 Type 2 diabetes mellitus with unspecified complications: Secondary | ICD-10-CM | POA: Diagnosis not present

## 2018-03-21 DIAGNOSIS — R7309 Other abnormal glucose: Secondary | ICD-10-CM | POA: Diagnosis not present

## 2018-03-21 DIAGNOSIS — E785 Hyperlipidemia, unspecified: Secondary | ICD-10-CM | POA: Diagnosis not present

## 2018-03-21 DIAGNOSIS — I10 Essential (primary) hypertension: Secondary | ICD-10-CM | POA: Diagnosis not present

## 2018-03-23 DIAGNOSIS — F17290 Nicotine dependence, other tobacco product, uncomplicated: Secondary | ICD-10-CM | POA: Diagnosis not present

## 2018-03-23 DIAGNOSIS — I10 Essential (primary) hypertension: Secondary | ICD-10-CM | POA: Diagnosis not present

## 2018-03-23 DIAGNOSIS — R7309 Other abnormal glucose: Secondary | ICD-10-CM | POA: Diagnosis not present

## 2018-03-30 DIAGNOSIS — Z7251 High risk heterosexual behavior: Secondary | ICD-10-CM | POA: Diagnosis not present

## 2018-03-30 DIAGNOSIS — Z9189 Other specified personal risk factors, not elsewhere classified: Secondary | ICD-10-CM | POA: Diagnosis not present

## 2018-05-04 DIAGNOSIS — L648 Other androgenic alopecia: Secondary | ICD-10-CM | POA: Diagnosis not present

## 2018-05-18 DIAGNOSIS — F332 Major depressive disorder, recurrent severe without psychotic features: Secondary | ICD-10-CM | POA: Diagnosis not present

## 2018-07-12 DIAGNOSIS — H52203 Unspecified astigmatism, bilateral: Secondary | ICD-10-CM | POA: Diagnosis not present

## 2018-07-12 DIAGNOSIS — H5203 Hypermetropia, bilateral: Secondary | ICD-10-CM | POA: Diagnosis not present

## 2018-07-12 DIAGNOSIS — H25013 Cortical age-related cataract, bilateral: Secondary | ICD-10-CM | POA: Diagnosis not present

## 2018-07-12 DIAGNOSIS — H2513 Age-related nuclear cataract, bilateral: Secondary | ICD-10-CM | POA: Diagnosis not present

## 2018-07-25 DIAGNOSIS — R7309 Other abnormal glucose: Secondary | ICD-10-CM | POA: Diagnosis not present

## 2018-07-25 DIAGNOSIS — G5690 Unspecified mononeuropathy of unspecified upper limb: Secondary | ICD-10-CM | POA: Diagnosis not present

## 2018-07-25 DIAGNOSIS — Z6822 Body mass index (BMI) 22.0-22.9, adult: Secondary | ICD-10-CM | POA: Diagnosis not present

## 2018-07-25 DIAGNOSIS — I10 Essential (primary) hypertension: Secondary | ICD-10-CM | POA: Diagnosis not present

## 2018-07-25 DIAGNOSIS — F339 Major depressive disorder, recurrent, unspecified: Secondary | ICD-10-CM | POA: Diagnosis not present

## 2018-07-28 ENCOUNTER — Other Ambulatory Visit: Payer: Self-pay

## 2018-07-31 DIAGNOSIS — Z0000 Encounter for general adult medical examination without abnormal findings: Secondary | ICD-10-CM | POA: Diagnosis not present

## 2018-08-15 DIAGNOSIS — F332 Major depressive disorder, recurrent severe without psychotic features: Secondary | ICD-10-CM | POA: Diagnosis not present

## 2018-09-12 DIAGNOSIS — M79641 Pain in right hand: Secondary | ICD-10-CM | POA: Insufficient documentation

## 2018-09-12 DIAGNOSIS — G5601 Carpal tunnel syndrome, right upper limb: Secondary | ICD-10-CM | POA: Diagnosis not present

## 2018-09-25 DIAGNOSIS — G5601 Carpal tunnel syndrome, right upper limb: Secondary | ICD-10-CM | POA: Diagnosis not present

## 2018-09-29 DIAGNOSIS — M79641 Pain in right hand: Secondary | ICD-10-CM | POA: Diagnosis not present

## 2018-09-29 DIAGNOSIS — G5601 Carpal tunnel syndrome, right upper limb: Secondary | ICD-10-CM | POA: Diagnosis not present

## 2018-11-07 DIAGNOSIS — L649 Androgenic alopecia, unspecified: Secondary | ICD-10-CM | POA: Diagnosis not present

## 2018-11-07 DIAGNOSIS — L218 Other seborrheic dermatitis: Secondary | ICD-10-CM | POA: Diagnosis not present

## 2018-11-10 DIAGNOSIS — Z23 Encounter for immunization: Secondary | ICD-10-CM | POA: Diagnosis not present

## 2018-11-13 DIAGNOSIS — E782 Mixed hyperlipidemia: Secondary | ICD-10-CM | POA: Diagnosis not present

## 2018-11-13 DIAGNOSIS — Z6822 Body mass index (BMI) 22.0-22.9, adult: Secondary | ICD-10-CM | POA: Diagnosis not present

## 2018-11-13 DIAGNOSIS — R7302 Impaired glucose tolerance (oral): Secondary | ICD-10-CM | POA: Diagnosis not present

## 2018-11-13 DIAGNOSIS — I10 Essential (primary) hypertension: Secondary | ICD-10-CM | POA: Diagnosis not present

## 2018-11-14 ENCOUNTER — Other Ambulatory Visit: Payer: Self-pay

## 2018-11-14 DIAGNOSIS — F332 Major depressive disorder, recurrent severe without psychotic features: Secondary | ICD-10-CM | POA: Diagnosis not present

## 2019-01-13 ENCOUNTER — Encounter: Payer: Self-pay | Admitting: Podiatry

## 2019-01-13 ENCOUNTER — Ambulatory Visit (INDEPENDENT_AMBULATORY_CARE_PROVIDER_SITE_OTHER): Payer: Medicare Other

## 2019-01-13 ENCOUNTER — Other Ambulatory Visit: Payer: Self-pay | Admitting: Podiatry

## 2019-01-13 ENCOUNTER — Ambulatory Visit (INDEPENDENT_AMBULATORY_CARE_PROVIDER_SITE_OTHER): Payer: Medicare Other | Admitting: Podiatry

## 2019-01-13 DIAGNOSIS — L603 Nail dystrophy: Secondary | ICD-10-CM | POA: Diagnosis not present

## 2019-01-13 DIAGNOSIS — M779 Enthesopathy, unspecified: Secondary | ICD-10-CM

## 2019-01-13 DIAGNOSIS — M2030 Hallux varus (acquired), unspecified foot: Secondary | ICD-10-CM

## 2019-01-13 NOTE — Progress Notes (Signed)
Subjective:    Patient ID: [name], female    DOB: [birth year], 68 y.o.   MRN: [id number]  HPI 68 year old female presents the office today for concerns of pain to both of her big toes.  She is concerned that she has bone spurs underneath her toenail on both sides.  She states that she previously had bone spur resection in 1986 in the left big toe that last 10 years she has been having some pain to the toe and she feels a bone spurs grown back and there is been pressure underneath the toenail.  On the right side she is been getting some discomfort over the last 2 years but currently not having any discomfort to the area.  She denies any redness or drainage or any swelling to the toenail site.  She had no recent treatment.  She has no other concerns.   Review of Systems  All other systems reviewed and are negative.  History reviewed. No pertinent past medical history.  Past Surgical History:  Procedure Laterality Date  . BREAST EXCISIONAL BIOPSY Left 1981     Current Outpatient Medications:  .  buPROPion (WELLBUTRIN XL) 150 MG 24 hr tablet, TAKE 1 TABLET BY MOUTH DAILY FOR DEPRESSION, Disp: , Rfl:  .  finasteride (PROPECIA) 1 MG tablet, TK 1 T PO QD, Disp: , Rfl:  .  FLUAD 0.5 ML SUSY, ADM 0.5ML IM UTD, Disp: , Rfl:  .  labetalol (NORMODYNE) 200 MG tablet, labetalol 200 mg tablet  TK 2 TS PO BID, Disp: , Rfl:  .  QUEtiapine (SEROQUEL) 25 MG tablet, quetiapine 25 mg tablet, Disp: , Rfl:  .  sertraline (ZOLOFT) 50 MG tablet, sertraline 50 mg tablet  TK 1 T PO QAM FOR DEPRESSION, Disp: , Rfl:  .  spironolactone (ALDACTONE) 25 MG tablet, spironolactone 25 mg tablet, Disp: , Rfl:   No Known Allergies       Objective:   Physical Exam  General: AAO x3, NAD  Dermatological: Bilateral hallux toenails are hypertrophic, dystrophic with brown discoloration.  On the left side there is tenderness mostly on the medial aspect but there is no edema, erythema and there is some callus  formation on the nail border as well.  No clinical signs of infection are noted today.  No open lesions.  Vascular: Dorsalis Pedis artery and Posterior Tibial artery pedal pulses are 2/4 bilateral with immedate capillary fill time. There is no pain with calf compression, swelling, warmth, erythema.   Neruologic: Grossly intact via light touch bilateral.Protective threshold with Semmes Wienstein monofilament intact to all pedal sites bilateral.  Musculoskeletal: No gross boney pedal deformities bilateral. No pain, crepitus, or limitation noted with foot and ankle range of motion bilateral. Muscular strength 5/5 in all groups tested bilateral.  Gait: Unassisted, Nonantalgic.     Assessment & Plan:  68 year old female with bilateral hallux onychodystrophy right hallux bone spur -Treatment options discussed including all alternatives, risks, and complications -Etiology of symptoms were discussed -X-rays were obtained and reviewed with the patient.  No significant bone spurs present along the dorsal left hallux however there is a bone spur present on the right side.  No evidence of acute fracture. -She presents today thinking there is a bone spur underneath the toenail has regrown the left side.  Fortunately the bone spurs not come back.  I think majority of her pain is due to the ingrowing of the nail distally as the pain is only on the distal medial nail  corner and there is new callus formation.  Is able to debride this today without any complications or bleeding and the pain improved.  I dispensed a urea cream to help thin the toenails and help with the callus formation.  I think the thick toenail putting pressure in the shoes is causing majority of issues and not a bone spur on the left.  We will do Epson salt soaks as well for a couple of days.  Trula Slade DPM

## 2019-01-13 NOTE — Patient Instructions (Signed)

## 2019-02-13 DIAGNOSIS — F332 Major depressive disorder, recurrent severe without psychotic features: Secondary | ICD-10-CM | POA: Diagnosis not present

## 2019-03-01 ENCOUNTER — Other Ambulatory Visit: Payer: Self-pay | Admitting: Family Medicine

## 2019-03-01 DIAGNOSIS — Z1231 Encounter for screening mammogram for malignant neoplasm of breast: Secondary | ICD-10-CM

## 2019-03-15 DIAGNOSIS — I10 Essential (primary) hypertension: Secondary | ICD-10-CM | POA: Diagnosis not present

## 2019-03-28 ENCOUNTER — Ambulatory Visit: Payer: Medicare Other

## 2019-04-12 DIAGNOSIS — F332 Major depressive disorder, recurrent severe without psychotic features: Secondary | ICD-10-CM | POA: Diagnosis not present

## 2019-05-03 DIAGNOSIS — L218 Other seborrheic dermatitis: Secondary | ICD-10-CM | POA: Diagnosis not present

## 2019-05-03 DIAGNOSIS — L648 Other androgenic alopecia: Secondary | ICD-10-CM | POA: Diagnosis not present

## 2019-05-25 ENCOUNTER — Ambulatory Visit: Payer: Medicare Other

## 2019-06-07 DIAGNOSIS — F332 Major depressive disorder, recurrent severe without psychotic features: Secondary | ICD-10-CM | POA: Diagnosis not present

## 2019-06-11 DIAGNOSIS — R799 Abnormal finding of blood chemistry, unspecified: Secondary | ICD-10-CM | POA: Diagnosis not present

## 2019-06-11 DIAGNOSIS — E119 Type 2 diabetes mellitus without complications: Secondary | ICD-10-CM | POA: Diagnosis not present

## 2019-06-11 DIAGNOSIS — F5111 Primary hypersomnia: Secondary | ICD-10-CM | POA: Diagnosis not present

## 2019-06-11 DIAGNOSIS — R7302 Impaired glucose tolerance (oral): Secondary | ICD-10-CM | POA: Diagnosis not present

## 2019-06-11 DIAGNOSIS — I10 Essential (primary) hypertension: Secondary | ICD-10-CM | POA: Diagnosis not present

## 2019-06-11 DIAGNOSIS — G5690 Unspecified mononeuropathy of unspecified upper limb: Secondary | ICD-10-CM | POA: Diagnosis not present

## 2019-06-11 DIAGNOSIS — R7309 Other abnormal glucose: Secondary | ICD-10-CM | POA: Diagnosis not present

## 2019-06-11 DIAGNOSIS — E785 Hyperlipidemia, unspecified: Secondary | ICD-10-CM | POA: Diagnosis not present

## 2019-06-11 DIAGNOSIS — F339 Major depressive disorder, recurrent, unspecified: Secondary | ICD-10-CM | POA: Diagnosis not present

## 2019-06-11 DIAGNOSIS — E782 Mixed hyperlipidemia: Secondary | ICD-10-CM | POA: Diagnosis not present

## 2019-06-11 DIAGNOSIS — E118 Type 2 diabetes mellitus with unspecified complications: Secondary | ICD-10-CM | POA: Diagnosis not present

## 2019-06-19 DIAGNOSIS — Z7251 High risk heterosexual behavior: Secondary | ICD-10-CM | POA: Diagnosis not present

## 2019-06-19 DIAGNOSIS — Z9189 Other specified personal risk factors, not elsewhere classified: Secondary | ICD-10-CM | POA: Diagnosis not present

## 2019-07-04 ENCOUNTER — Ambulatory Visit
Admission: RE | Admit: 2019-07-04 | Discharge: 2019-07-04 | Disposition: A | Discharge status: Home / Self Care | Payer: Medicare Other | Source: Ambulatory Visit | Attending: Family Medicine | Admitting: Family Medicine

## 2019-07-04 ENCOUNTER — Other Ambulatory Visit: Payer: Self-pay

## 2019-07-04 DIAGNOSIS — Z1231 Encounter for screening mammogram for malignant neoplasm of breast: Secondary | ICD-10-CM | POA: Diagnosis not present

## 2019-07-17 DIAGNOSIS — H52203 Unspecified astigmatism, bilateral: Secondary | ICD-10-CM | POA: Diagnosis not present

## 2019-07-17 DIAGNOSIS — H5203 Hypermetropia, bilateral: Secondary | ICD-10-CM | POA: Diagnosis not present

## 2019-07-17 DIAGNOSIS — H2000 Unspecified acute and subacute iridocyclitis: Secondary | ICD-10-CM | POA: Diagnosis not present

## 2019-07-24 DIAGNOSIS — H2000 Unspecified acute and subacute iridocyclitis: Secondary | ICD-10-CM | POA: Diagnosis not present

## 2019-08-06 DIAGNOSIS — F332 Major depressive disorder, recurrent severe without psychotic features: Secondary | ICD-10-CM | POA: Diagnosis not present

## 2019-10-03 DIAGNOSIS — F332 Major depressive disorder, recurrent severe without psychotic features: Secondary | ICD-10-CM | POA: Diagnosis not present

## 2019-10-11 DIAGNOSIS — I10 Essential (primary) hypertension: Secondary | ICD-10-CM | POA: Diagnosis not present

## 2019-10-11 DIAGNOSIS — F339 Major depressive disorder, recurrent, unspecified: Secondary | ICD-10-CM | POA: Diagnosis not present

## 2019-10-11 DIAGNOSIS — Z23 Encounter for immunization: Secondary | ICD-10-CM | POA: Diagnosis not present

## 2019-11-21 ENCOUNTER — Other Ambulatory Visit: Payer: Self-pay

## 2019-11-28 DIAGNOSIS — F332 Major depressive disorder, recurrent severe without psychotic features: Secondary | ICD-10-CM | POA: Diagnosis not present

## 2020-02-29 DIAGNOSIS — R7309 Other abnormal glucose: Secondary | ICD-10-CM | POA: Diagnosis not present

## 2020-02-29 DIAGNOSIS — Z6823 Body mass index (BMI) 23.0-23.9, adult: Secondary | ICD-10-CM | POA: Diagnosis not present

## 2020-02-29 DIAGNOSIS — I10 Essential (primary) hypertension: Secondary | ICD-10-CM | POA: Diagnosis not present

## 2020-02-29 DIAGNOSIS — F339 Major depressive disorder, recurrent, unspecified: Secondary | ICD-10-CM | POA: Diagnosis not present

## 2020-03-26 DIAGNOSIS — H903 Sensorineural hearing loss, bilateral: Secondary | ICD-10-CM | POA: Diagnosis not present

## 2020-05-28 ENCOUNTER — Other Ambulatory Visit: Payer: Self-pay | Admitting: Obstetrics and Gynecology

## 2020-05-28 DIAGNOSIS — Z1231 Encounter for screening mammogram for malignant neoplasm of breast: Secondary | ICD-10-CM

## 2020-06-05 DIAGNOSIS — E782 Mixed hyperlipidemia: Secondary | ICD-10-CM | POA: Diagnosis not present

## 2020-06-05 DIAGNOSIS — I10 Essential (primary) hypertension: Secondary | ICD-10-CM | POA: Diagnosis not present

## 2020-06-05 DIAGNOSIS — F339 Major depressive disorder, recurrent, unspecified: Secondary | ICD-10-CM | POA: Diagnosis not present

## 2020-06-05 DIAGNOSIS — R7302 Impaired glucose tolerance (oral): Secondary | ICD-10-CM | POA: Diagnosis not present

## 2020-06-10 DIAGNOSIS — K219 Gastro-esophageal reflux disease without esophagitis: Secondary | ICD-10-CM | POA: Diagnosis not present

## 2020-06-10 DIAGNOSIS — I10 Essential (primary) hypertension: Secondary | ICD-10-CM | POA: Diagnosis not present

## 2020-06-10 DIAGNOSIS — E782 Mixed hyperlipidemia: Secondary | ICD-10-CM | POA: Diagnosis not present

## 2020-06-10 DIAGNOSIS — F339 Major depressive disorder, recurrent, unspecified: Secondary | ICD-10-CM | POA: Diagnosis not present

## 2020-06-12 DIAGNOSIS — L648 Other androgenic alopecia: Secondary | ICD-10-CM | POA: Diagnosis not present

## 2020-07-04 ENCOUNTER — Other Ambulatory Visit: Payer: Self-pay

## 2020-07-04 ENCOUNTER — Ambulatory Visit
Admission: RE | Admit: 2020-07-04 | Discharge: 2020-07-04 | Disposition: A | Discharge status: Home / Self Care | Payer: Medicare Other | Source: Ambulatory Visit | Attending: Obstetrics and Gynecology | Admitting: Obstetrics and Gynecology

## 2020-07-04 DIAGNOSIS — Z1231 Encounter for screening mammogram for malignant neoplasm of breast: Secondary | ICD-10-CM

## 2020-07-15 DIAGNOSIS — F332 Major depressive disorder, recurrent severe without psychotic features: Secondary | ICD-10-CM | POA: Diagnosis not present

## 2020-07-24 DIAGNOSIS — H40033 Anatomical narrow angle, bilateral: Secondary | ICD-10-CM | POA: Diagnosis not present

## 2020-07-24 DIAGNOSIS — H2513 Age-related nuclear cataract, bilateral: Secondary | ICD-10-CM | POA: Diagnosis not present

## 2020-07-24 DIAGNOSIS — H52203 Unspecified astigmatism, bilateral: Secondary | ICD-10-CM | POA: Diagnosis not present

## 2020-07-24 DIAGNOSIS — H40013 Open angle with borderline findings, low risk, bilateral: Secondary | ICD-10-CM | POA: Diagnosis not present

## 2020-08-11 DIAGNOSIS — F332 Major depressive disorder, recurrent severe without psychotic features: Secondary | ICD-10-CM | POA: Diagnosis not present

## 2020-09-09 DIAGNOSIS — F332 Major depressive disorder, recurrent severe without psychotic features: Secondary | ICD-10-CM | POA: Diagnosis not present

## 2020-09-23 DIAGNOSIS — F332 Major depressive disorder, recurrent severe without psychotic features: Secondary | ICD-10-CM | POA: Diagnosis not present

## 2020-10-08 DIAGNOSIS — E782 Mixed hyperlipidemia: Secondary | ICD-10-CM | POA: Diagnosis not present

## 2020-10-08 DIAGNOSIS — W010XXA Fall on same level from slipping, tripping and stumbling without subsequent striking against object, initial encounter: Secondary | ICD-10-CM | POA: Diagnosis not present

## 2020-10-08 DIAGNOSIS — R634 Abnormal weight loss: Secondary | ICD-10-CM | POA: Diagnosis not present

## 2020-10-08 DIAGNOSIS — Z23 Encounter for immunization: Secondary | ICD-10-CM | POA: Diagnosis not present

## 2020-10-08 DIAGNOSIS — I110 Hypertensive heart disease with heart failure: Secondary | ICD-10-CM | POA: Diagnosis not present

## 2020-10-08 DIAGNOSIS — R7303 Prediabetes: Secondary | ICD-10-CM | POA: Diagnosis not present

## 2020-10-08 DIAGNOSIS — I10 Essential (primary) hypertension: Secondary | ICD-10-CM | POA: Diagnosis not present

## 2020-10-11 DIAGNOSIS — I10 Essential (primary) hypertension: Secondary | ICD-10-CM | POA: Diagnosis not present

## 2020-10-11 DIAGNOSIS — S20211A Contusion of right front wall of thorax, initial encounter: Secondary | ICD-10-CM | POA: Diagnosis not present

## 2020-10-11 DIAGNOSIS — S7001XA Contusion of right hip, initial encounter: Secondary | ICD-10-CM | POA: Diagnosis not present

## 2020-10-11 DIAGNOSIS — F329 Major depressive disorder, single episode, unspecified: Secondary | ICD-10-CM | POA: Diagnosis not present

## 2020-10-21 DIAGNOSIS — F332 Major depressive disorder, recurrent severe without psychotic features: Secondary | ICD-10-CM | POA: Diagnosis not present

## 2020-10-23 ENCOUNTER — Encounter: Payer: Self-pay | Admitting: *Deleted

## 2020-10-24 ENCOUNTER — Ambulatory Visit (INDEPENDENT_AMBULATORY_CARE_PROVIDER_SITE_OTHER): Payer: Medicare Other | Admitting: Neurology

## 2020-10-24 ENCOUNTER — Encounter: Payer: Self-pay | Admitting: Neurology

## 2020-10-24 VITALS — BP 103/53 | HR 60 | Ht 62.0 in | Wt 117.0 lb

## 2020-10-24 DIAGNOSIS — E538 Deficiency of other specified B group vitamins: Secondary | ICD-10-CM | POA: Diagnosis not present

## 2020-10-24 DIAGNOSIS — R269 Unspecified abnormalities of gait and mobility: Secondary | ICD-10-CM | POA: Diagnosis not present

## 2020-10-24 DIAGNOSIS — R7989 Other specified abnormal findings of blood chemistry: Secondary | ICD-10-CM | POA: Diagnosis not present

## 2020-10-24 NOTE — Progress Notes (Signed)
Reason for visit: Gait disorder  Referring physician: Dr. Dione Housekeeper [name] is a 69 y.o. female  History of present illness:  [name] is a 69 year old right-handed black female with a history of borderline diabetes and alcohol overuse.  The patient currently indicates that she is drinking on average 3 beers a day, she has had recent blood work that shows a minor elevation in liver enzymes.  The patient claims that about 2 months ago she had a very sudden change in her ability to walk.  The patient claims that from 1 day to the next she began having problems with staggering and she has fallen on occasion.  Since onset, the patient believes that the gait disturbance has been stable and has not progressed or worsened.  She reports some occasional numbness in the right hand but no numbness in the feet or on the body or face.  She denies any weakness of the extremities.  She does have some low back pain without radiation to the legs.  She denies issues controlling the bowels or the bladder.  She denies any neck pain.  She has not had any changes in vision or any change in speech or swallowing.  She does report some occasional word finding problems.  She has not had any tremors.  She does have a history of depression, she was on Seroquel, Abilify, and Latuda, she believes that she was on all these medications at the same time and that these medications were stopped about 2 months ago.  She is followed through psychiatry.  She denies any tremors or involuntary movements.  She is sent to this office for further evaluation.  Past Medical History:  Diagnosis Date  . Anxiety   . Breast cyst 1982  . Depression   . Eczema   . Hypertension   . Lipid disorder   . Mood swings     Past Surgical History:  Procedure Laterality Date  . BREAST EXCISIONAL BIOPSY Left 1981  . CARPAL TUNNEL RELEASE  2007    Family History  Problem Relation Age of Onset  . Breast cancer Maternal Aunt   .  Hypertension Mother   . Cancer Mother        cervical  . Diabetes Mother   . Hypertension Father   . Diabetes Father   . Diabetes Sister   . Hypertension Sister   . Diabetes Brother   . Hypertension Brother     Social history:  has an unknown smoking status. She has never used smokeless tobacco. No history on file for alcohol use and drug use.  Medications:  Prior to Admission medications   Medication Sig Start Date End Date Taking? Authorizing Provider  buPROPion (WELLBUTRIN XL) 150 MG 24 hr tablet TAKE 1 TABLET BY MOUTH DAILY FOR DEPRESSION 12/29/18  Yes [provider]  buPROPion (WELLBUTRIN XL) 300 MG 24 hr tablet Take 300 mg by mouth daily. 06/13/20  Yes [provider]  finasteride (PROPECIA) 1 MG tablet TK 1 T PO QD 12/31/18  Yes [provider]  FLUAD 0.5 ML SUSY ADM 0.5ML IM UTD 11/10/18  Yes [provider]  FLUoxetine (PROZAC) 40 MG capsule Take 40 mg by mouth daily.   Yes [provider]  labetalol (NORMODYNE) 200 MG tablet labetalol 200 mg tablet  TK 2 TS PO BID   Yes [provider]  sertraline (ZOLOFT) 50 MG tablet sertraline 50 mg tablet  TK 1 T PO QAM FOR DEPRESSION  Yes [provider]  spironolactone (ALDACTONE) 25 MG tablet spironolactone 25 mg tablet   Yes [provider]     No Known Allergies  ROS:  Out of a complete 14 system review of symptoms, the patient complains only of the following symptoms, and all other reviewed systems are negative.  Walking difficulty Low back pain Depression  Blood pressure (!) 103/53, pulse 60, height 5\' 2"  (1.575 m), weight 117 lb (53.1 kg).  Physical Exam  General: The patient is alert and cooperative at the time of the examination.  Eyes: Pupils are equal, round, and reactive to light. Discs are flat bilaterally.  Neck: The neck is supple, no carotid bruits are noted.  Respiratory: The respiratory examination is clear.  Cardiovascular: The  cardiovascular examination reveals a regular rate and rhythm, no obvious murmurs or rubs are noted.  Skin: Extremities are without significant edema.  Neurologic Exam  Mental status: The patient is alert and oriented x 3 at the time of the examination. The patient has apparent normal recent and remote memory, with an apparently normal attention span and concentration ability.  Cranial nerves: Facial symmetry is present. There is good sensation of the face to pinprick and soft touch bilaterally. The strength of the facial muscles and the muscles to head turning and shoulder shrug are normal bilaterally. Speech is well enunciated, no aphasia or dysarthria is noted. Extraocular movements are full. Visual fields are full. The tongue is midline, and the patient has symmetric elevation of the soft palate. No obvious hearing deficits are noted.  Motor: The motor testing reveals 5 over 5 strength of all 4 extremities. Good symmetric motor tone is noted throughout.  Sensory: Sensory testing is intact to pinprick, soft touch, vibration sensation, and position sense on all 4 extremities, with exception of a stocking pattern pinprick sensory deficit to the knees bilaterally and some decrease in position sensation in both feet. No evidence of extinction is noted.  Coordination: Cerebellar testing reveals good finger-nose-finger and heel-to-shin bilaterally.  Gait and station: Gait is normal. Tandem gait is slightly unsteady. Romberg is negative. No drift is seen.  Reflexes: Deep tendon reflexes are symmetric, but are somewhat depressed bilaterally. Toes are downgoing bilaterally.   Assessment/Plan:  1.  Mild gait disturbance  2.  History of depression  3.  History of alcohol overuse  4.  Mild elevation of liver function tests  The patient claims that the onset of the walking problem was sudden in nature and for this reason we need to exclude cerebrovascular disease.  The patient was set up for MRI  of the brain.  She will have blood work done today.  The clinical examination does show some decrease in position sense in both feet, if the above studies are unrevealing we may consider nerve conduction study and EMG in the future.  The patient may benefit from physical therapy in the future as the walking issues appear to be relatively mild at this time.  She will follow up here in 4 months.  Jill Alexanders MD 10/24/2020 9:00 AM  [facility] [address] [place], [place] [address]  Phone [phone] Fax [phone]

## 2020-10-27 ENCOUNTER — Telehealth: Payer: Self-pay | Admitting: Neurology

## 2020-10-27 NOTE — Telephone Encounter (Signed)
Medicare/bcbs supp order sent to GI. No auth they will reach out to the patient to schedule.  

## 2020-10-29 LAB — HEPATITIS B CORE ANTIBODY, TOTAL: Hep B Core Total Ab: NEGATIVE

## 2020-10-29 LAB — RPR: RPR Ser Ql: NONREACTIVE

## 2020-10-29 LAB — VITAMIN B12: Vitamin B-12: 420 pg/mL (ref 232–1245)

## 2020-10-29 LAB — HEPATITIS C ANTIBODY: Hep C Virus Ab: 0.1 s/co ratio (ref 0.0–0.9)

## 2020-10-29 LAB — COPPER, SERUM: Copper: 127 ug/dL (ref 80–158)

## 2020-10-29 LAB — HEPATITIS B SURFACE ANTIBODY,QUALITATIVE: Hep B Surface Ab, Qual: NONREACTIVE

## 2020-10-29 LAB — SEDIMENTATION RATE: Sed Rate: 7 mm/hr (ref 0–40)

## 2020-11-05 ENCOUNTER — Telehealth: Payer: Self-pay | Admitting: Emergency Medicine

## 2020-11-05 NOTE — Telephone Encounter (Signed)
Called and left VM for patient on home number (DPR) regarding Dr. Tobey Grim findings on patient's blood work.  Left office number to return call if patient had any questions.

## 2020-11-05 NOTE — Telephone Encounter (Signed)
-----   Message from Kathrynn Ducking, MD sent at 10/29/2020  1:22 PM EDT -----  The blood work results are unremarkable. Please call the patient. ----- Message ----- From: Lavone Neri Lab Results In Sent: 10/25/2020   7:37 AM EDT To: Kathrynn Ducking, MD

## 2020-11-25 DIAGNOSIS — Z23 Encounter for immunization: Secondary | ICD-10-CM | POA: Diagnosis not present

## 2020-12-22 DIAGNOSIS — F332 Major depressive disorder, recurrent severe without psychotic features: Secondary | ICD-10-CM | POA: Diagnosis not present

## 2021-01-06 DIAGNOSIS — L648 Other androgenic alopecia: Secondary | ICD-10-CM | POA: Diagnosis not present

## 2021-02-18 DIAGNOSIS — F332 Major depressive disorder, recurrent severe without psychotic features: Secondary | ICD-10-CM | POA: Diagnosis not present

## 2021-02-23 ENCOUNTER — Telehealth: Payer: Self-pay | Admitting: Neurology

## 2021-02-23 ENCOUNTER — Ambulatory Visit: Payer: Medicare Other | Admitting: Neurology

## 2021-02-23 ENCOUNTER — Encounter: Payer: Self-pay | Admitting: Neurology

## 2021-02-23 NOTE — Telephone Encounter (Signed)
This patient did not show for a revisit evaluation today.

## 2021-04-16 DIAGNOSIS — Z0000 Encounter for general adult medical examination without abnormal findings: Secondary | ICD-10-CM | POA: Diagnosis not present

## 2021-04-17 DIAGNOSIS — F332 Major depressive disorder, recurrent severe without psychotic features: Secondary | ICD-10-CM | POA: Diagnosis not present

## 2021-05-13 DIAGNOSIS — F332 Major depressive disorder, recurrent severe without psychotic features: Secondary | ICD-10-CM | POA: Diagnosis not present

## 2021-05-26 DIAGNOSIS — G111 Early-onset cerebellar ataxia, unspecified: Secondary | ICD-10-CM | POA: Diagnosis not present

## 2021-05-26 DIAGNOSIS — E782 Mixed hyperlipidemia: Secondary | ICD-10-CM | POA: Diagnosis not present

## 2021-05-26 DIAGNOSIS — I10 Essential (primary) hypertension: Secondary | ICD-10-CM | POA: Diagnosis not present

## 2021-05-27 DIAGNOSIS — E782 Mixed hyperlipidemia: Secondary | ICD-10-CM | POA: Diagnosis not present

## 2021-05-27 DIAGNOSIS — F339 Major depressive disorder, recurrent, unspecified: Secondary | ICD-10-CM | POA: Diagnosis not present

## 2021-05-27 DIAGNOSIS — K219 Gastro-esophageal reflux disease without esophagitis: Secondary | ICD-10-CM | POA: Diagnosis not present

## 2021-05-27 DIAGNOSIS — I10 Essential (primary) hypertension: Secondary | ICD-10-CM | POA: Diagnosis not present

## 2021-06-11 DIAGNOSIS — F332 Major depressive disorder, recurrent severe without psychotic features: Secondary | ICD-10-CM | POA: Diagnosis not present

## 2021-06-23 DIAGNOSIS — Z01419 Encounter for gynecological examination (general) (routine) without abnormal findings: Secondary | ICD-10-CM | POA: Diagnosis not present

## 2021-06-25 DIAGNOSIS — I10 Essential (primary) hypertension: Secondary | ICD-10-CM | POA: Diagnosis not present

## 2021-06-25 DIAGNOSIS — E782 Mixed hyperlipidemia: Secondary | ICD-10-CM | POA: Diagnosis not present

## 2021-06-25 DIAGNOSIS — G111 Early-onset cerebellar ataxia, unspecified: Secondary | ICD-10-CM | POA: Diagnosis not present

## 2021-07-06 ENCOUNTER — Other Ambulatory Visit: Payer: Self-pay | Admitting: Family Medicine

## 2021-07-06 DIAGNOSIS — L649 Androgenic alopecia, unspecified: Secondary | ICD-10-CM | POA: Diagnosis not present

## 2021-07-06 DIAGNOSIS — Z1231 Encounter for screening mammogram for malignant neoplasm of breast: Secondary | ICD-10-CM

## 2021-07-07 ENCOUNTER — Other Ambulatory Visit: Payer: Self-pay

## 2021-07-07 ENCOUNTER — Ambulatory Visit
Admission: RE | Admit: 2021-07-07 | Discharge: 2021-07-07 | Disposition: A | Discharge status: Home / Self Care | Payer: Medicare Other | Source: Ambulatory Visit | Attending: Family Medicine | Admitting: Family Medicine

## 2021-07-07 DIAGNOSIS — Z1231 Encounter for screening mammogram for malignant neoplasm of breast: Secondary | ICD-10-CM

## 2021-08-03 DIAGNOSIS — H40033 Anatomical narrow angle, bilateral: Secondary | ICD-10-CM | POA: Diagnosis not present

## 2021-08-03 DIAGNOSIS — H5203 Hypermetropia, bilateral: Secondary | ICD-10-CM | POA: Diagnosis not present

## 2021-08-03 DIAGNOSIS — H2513 Age-related nuclear cataract, bilateral: Secondary | ICD-10-CM | POA: Diagnosis not present

## 2021-08-03 DIAGNOSIS — H52203 Unspecified astigmatism, bilateral: Secondary | ICD-10-CM | POA: Diagnosis not present

## 2021-08-19 DIAGNOSIS — M79641 Pain in right hand: Secondary | ICD-10-CM | POA: Diagnosis not present

## 2021-08-26 DIAGNOSIS — G111 Early-onset cerebellar ataxia, unspecified: Secondary | ICD-10-CM | POA: Diagnosis not present

## 2021-08-26 DIAGNOSIS — E782 Mixed hyperlipidemia: Secondary | ICD-10-CM | POA: Diagnosis not present

## 2021-08-26 DIAGNOSIS — I10 Essential (primary) hypertension: Secondary | ICD-10-CM | POA: Diagnosis not present

## 2021-09-08 DIAGNOSIS — F332 Major depressive disorder, recurrent severe without psychotic features: Secondary | ICD-10-CM | POA: Diagnosis not present

## 2021-09-16 DIAGNOSIS — M79641 Pain in right hand: Secondary | ICD-10-CM | POA: Diagnosis not present

## 2021-09-29 DIAGNOSIS — E782 Mixed hyperlipidemia: Secondary | ICD-10-CM | POA: Diagnosis not present

## 2021-09-29 DIAGNOSIS — I10 Essential (primary) hypertension: Secondary | ICD-10-CM | POA: Diagnosis not present

## 2021-09-29 DIAGNOSIS — F339 Major depressive disorder, recurrent, unspecified: Secondary | ICD-10-CM | POA: Diagnosis not present

## 2021-09-29 DIAGNOSIS — G5690 Unspecified mononeuropathy of unspecified upper limb: Secondary | ICD-10-CM | POA: Diagnosis not present

## 2021-09-29 DIAGNOSIS — K219 Gastro-esophageal reflux disease without esophagitis: Secondary | ICD-10-CM | POA: Diagnosis not present

## 2021-10-13 DIAGNOSIS — Z23 Encounter for immunization: Secondary | ICD-10-CM | POA: Diagnosis not present

## 2021-10-29 DIAGNOSIS — I10 Essential (primary) hypertension: Secondary | ICD-10-CM | POA: Diagnosis not present

## 2021-10-29 DIAGNOSIS — E782 Mixed hyperlipidemia: Secondary | ICD-10-CM | POA: Diagnosis not present

## 2021-10-29 DIAGNOSIS — F339 Major depressive disorder, recurrent, unspecified: Secondary | ICD-10-CM | POA: Diagnosis not present

## 2021-11-10 DIAGNOSIS — Z20822 Contact with and (suspected) exposure to covid-19: Secondary | ICD-10-CM | POA: Diagnosis not present

## 2021-11-25 DIAGNOSIS — E782 Mixed hyperlipidemia: Secondary | ICD-10-CM | POA: Diagnosis not present

## 2021-11-25 DIAGNOSIS — G111 Early-onset cerebellar ataxia, unspecified: Secondary | ICD-10-CM | POA: Diagnosis not present

## 2021-11-25 DIAGNOSIS — I10 Essential (primary) hypertension: Secondary | ICD-10-CM | POA: Diagnosis not present

## 2022-01-19 DIAGNOSIS — F332 Major depressive disorder, recurrent severe without psychotic features: Secondary | ICD-10-CM | POA: Diagnosis not present

## 2022-03-08 DIAGNOSIS — M79641 Pain in right hand: Secondary | ICD-10-CM | POA: Diagnosis not present

## 2022-03-12 DIAGNOSIS — I10 Essential (primary) hypertension: Secondary | ICD-10-CM | POA: Diagnosis not present

## 2022-03-12 DIAGNOSIS — R634 Abnormal weight loss: Secondary | ICD-10-CM | POA: Diagnosis not present

## 2022-03-12 DIAGNOSIS — F5111 Primary hypersomnia: Secondary | ICD-10-CM | POA: Diagnosis not present

## 2022-03-12 DIAGNOSIS — F339 Major depressive disorder, recurrent, unspecified: Secondary | ICD-10-CM | POA: Diagnosis not present

## 2022-04-14 DIAGNOSIS — F332 Major depressive disorder, recurrent severe without psychotic features: Secondary | ICD-10-CM | POA: Diagnosis not present

## 2022-05-04 IMAGING — MG MM DIGITAL SCREENING BILAT W/ TOMO AND CAD
6 of 10 series · 6 of 30 positions shown · non-contrast
Comparison: Previous exam(s).

CLINICAL DATA: Screening.

EXAM:
DIGITAL SCREENING BILATERAL MAMMOGRAM WITH TOMOSYNTHESIS AND CAD
TECHNIQUE: Bilateral screening digital craniocaudal and mediolateral oblique
mammograms were obtained. Bilateral screening digital breast
tomosynthesis was performed. The images were evaluated with
computer-aided detection.

[L CC synth-2D]
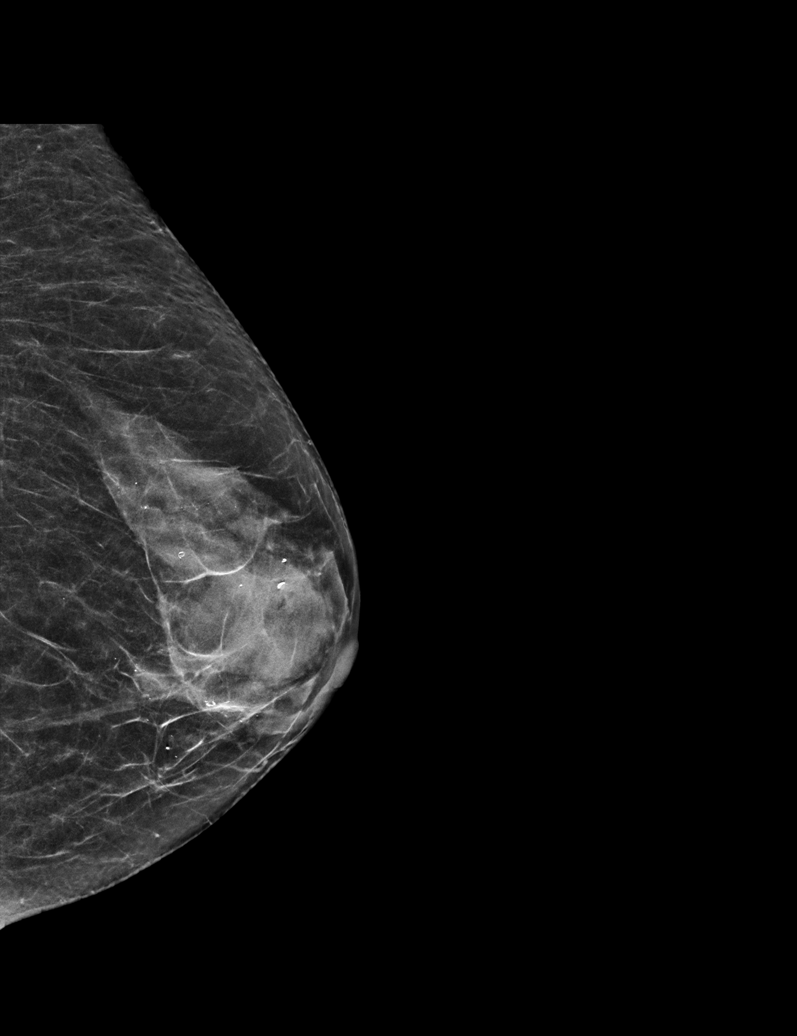

[L MLO synth-2D (1 of 2)]
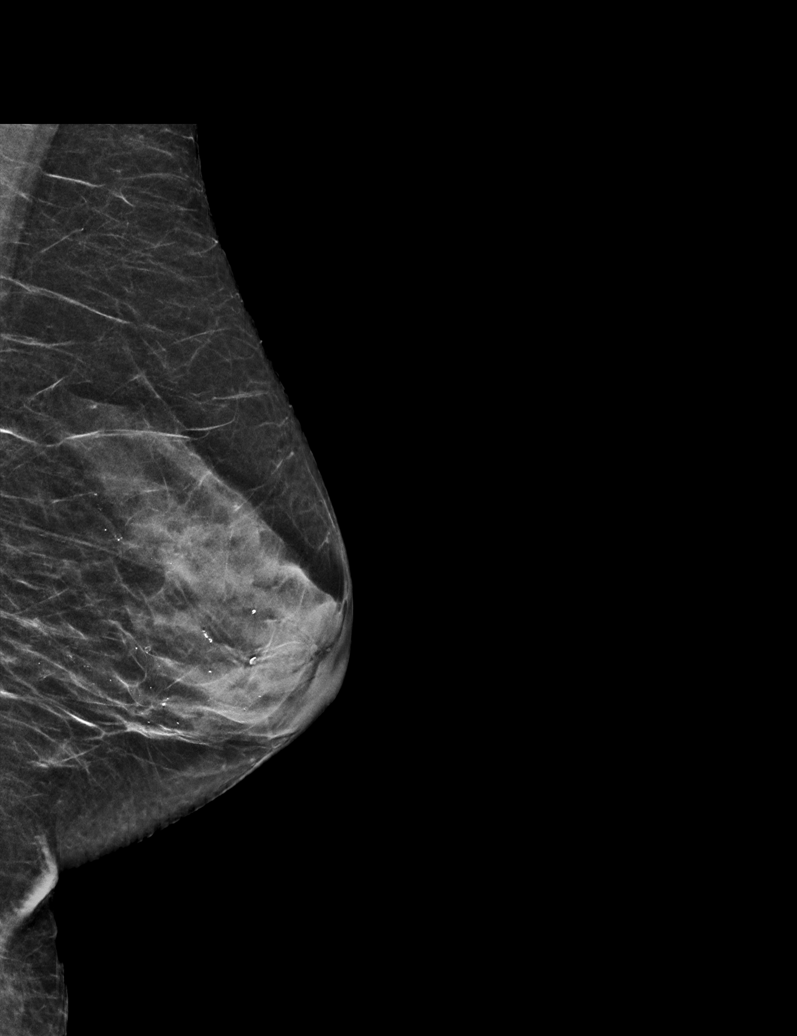

[R CC synth-2D]
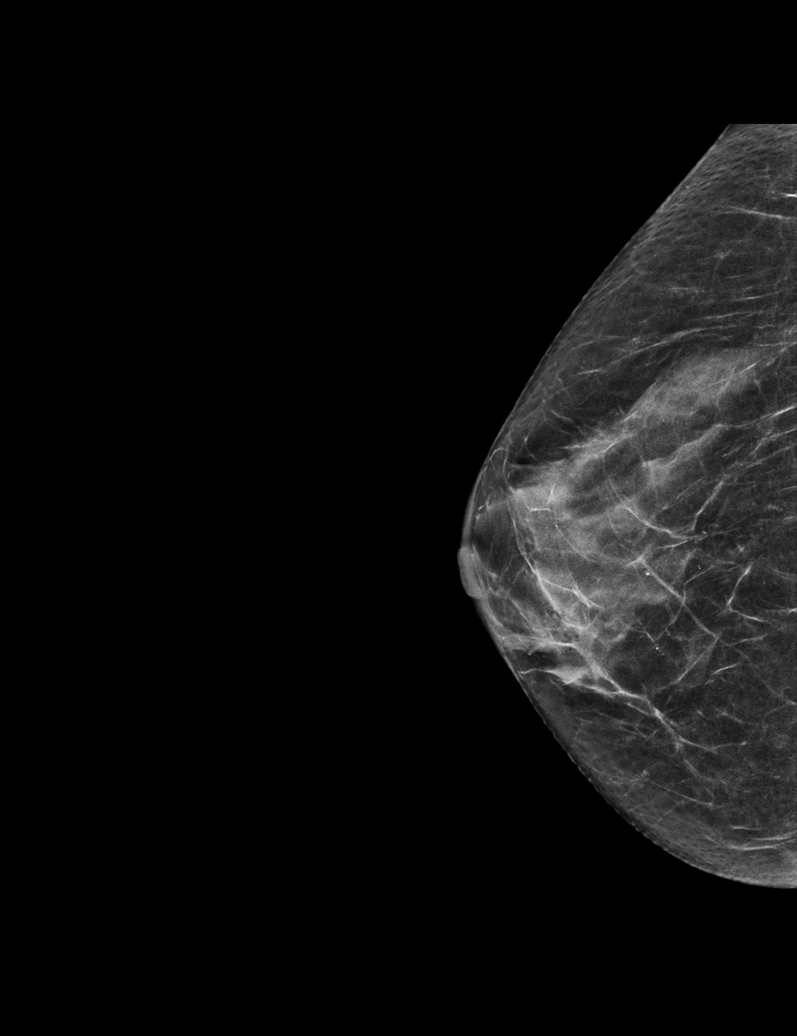

[R MLO synth-2D]
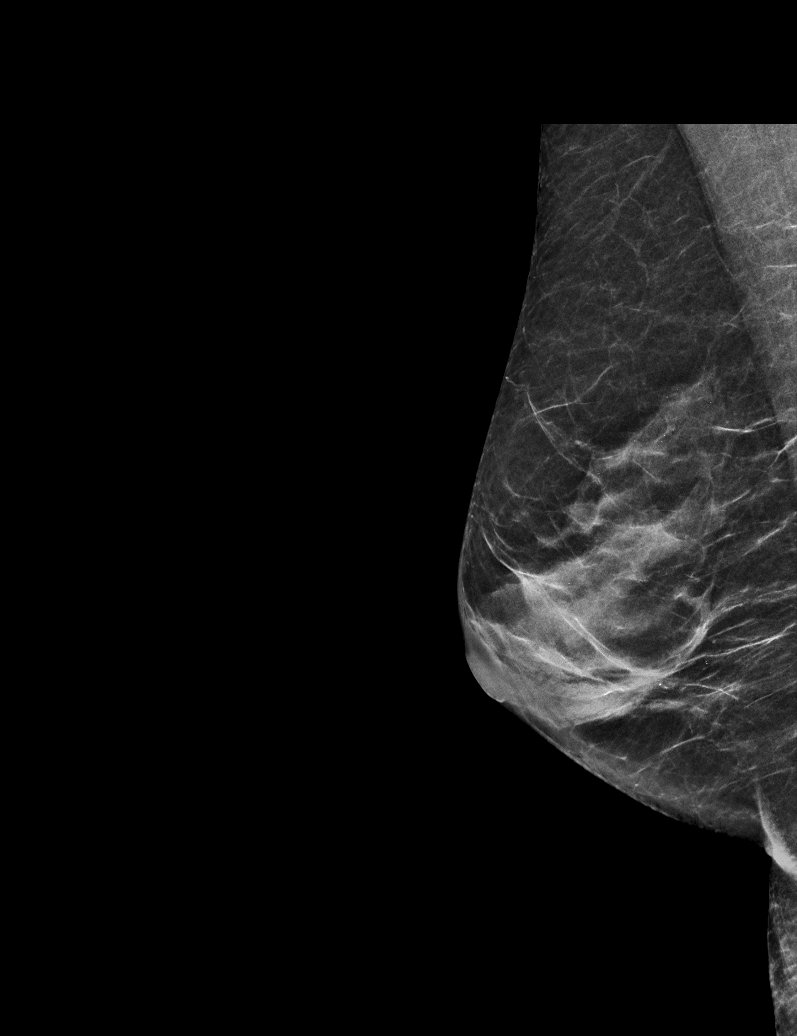

[L MLO synth-2D (2 of 2)]
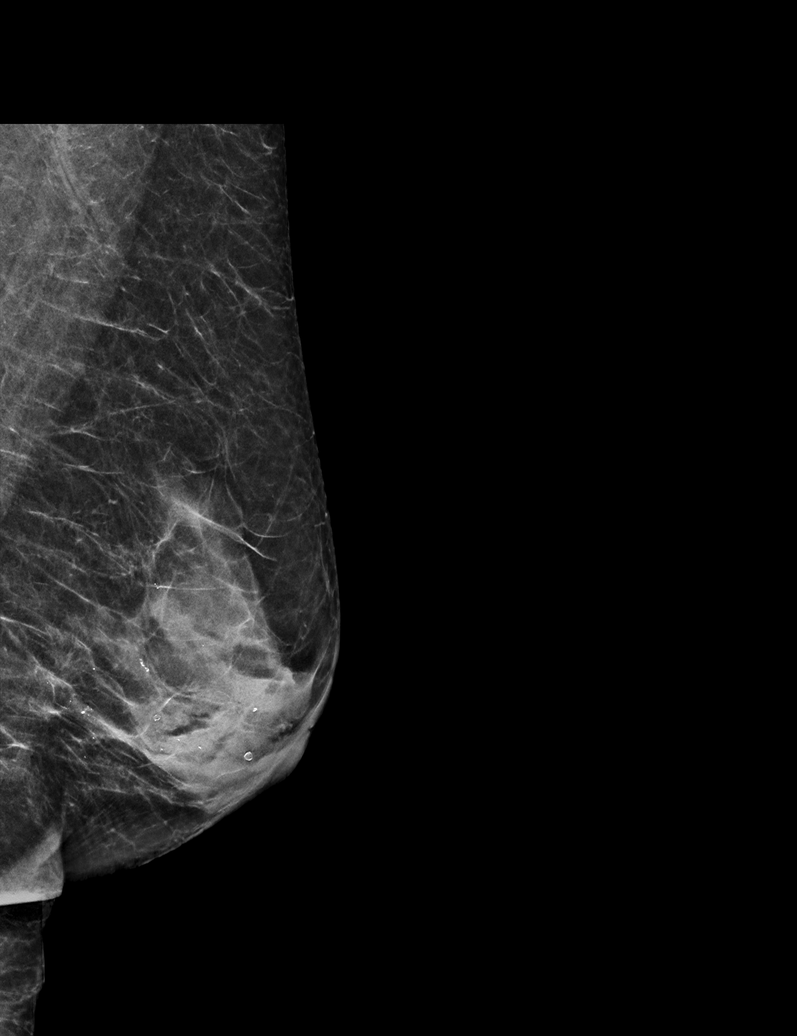

[L CC tomo · tomo slice 25/50.0]
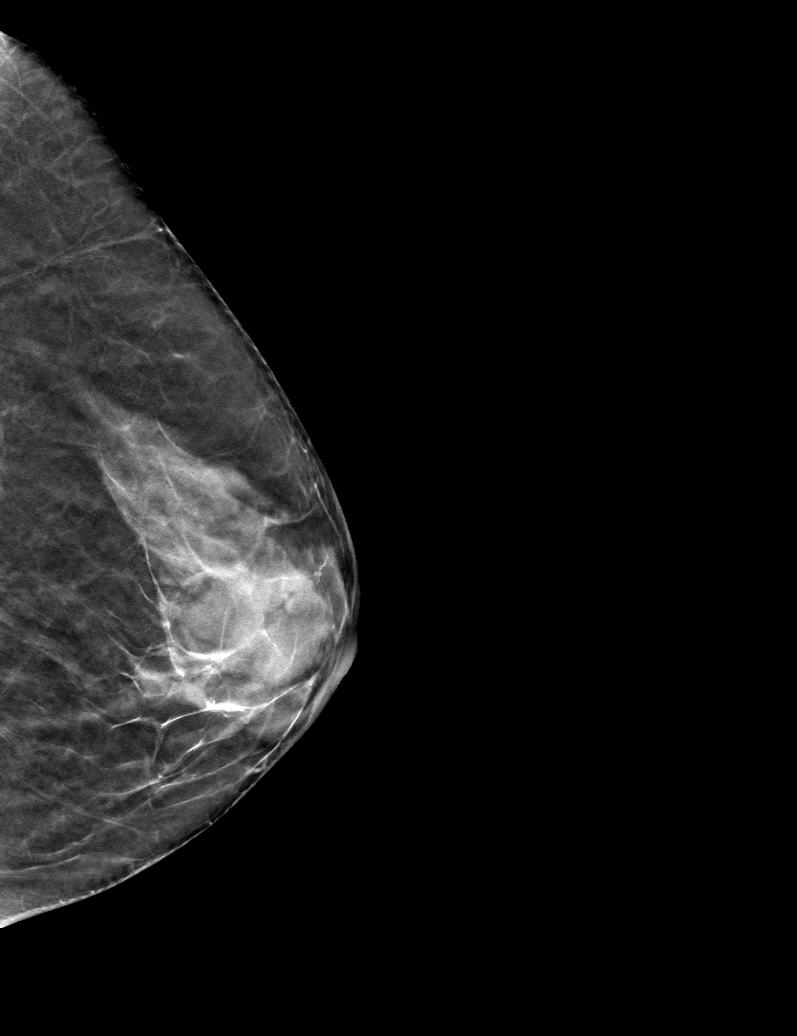

[6 of 30 positions shown; findings below may reference images not displayed]

ACR Breast Density Category c: The breast tissue is heterogeneously
dense, which may obscure small masses.
FINDINGS: There are no findings suspicious for malignancy.
IMPRESSION: No mammographic evidence of malignancy. A result letter of this
screening mammogram will be mailed directly to the patient.

RECOMMENDATION:
Screening mammogram in one year. (Code:Q3-W-BC3)

BI-RADS CATEGORY  1: Negative.

## 2022-05-11 DIAGNOSIS — L649 Androgenic alopecia, unspecified: Secondary | ICD-10-CM | POA: Diagnosis not present

## 2022-05-14 DIAGNOSIS — R7302 Impaired glucose tolerance (oral): Secondary | ICD-10-CM | POA: Diagnosis not present

## 2022-05-14 DIAGNOSIS — R634 Abnormal weight loss: Secondary | ICD-10-CM | POA: Diagnosis not present

## 2022-05-14 DIAGNOSIS — I10 Essential (primary) hypertension: Secondary | ICD-10-CM | POA: Diagnosis not present

## 2022-05-14 DIAGNOSIS — E782 Mixed hyperlipidemia: Secondary | ICD-10-CM | POA: Diagnosis not present

## 2022-05-14 DIAGNOSIS — G111 Early-onset cerebellar ataxia, unspecified: Secondary | ICD-10-CM | POA: Diagnosis not present

## 2022-05-14 DIAGNOSIS — F339 Major depressive disorder, recurrent, unspecified: Secondary | ICD-10-CM | POA: Diagnosis not present

## 2022-05-14 DIAGNOSIS — Z0000 Encounter for general adult medical examination without abnormal findings: Secondary | ICD-10-CM | POA: Diagnosis not present

## 2022-05-14 DIAGNOSIS — F4381 Prolonged grief disorder: Secondary | ICD-10-CM | POA: Diagnosis not present

## 2022-06-09 ENCOUNTER — Other Ambulatory Visit: Payer: Self-pay | Admitting: Family Medicine

## 2022-06-09 DIAGNOSIS — Z1231 Encounter for screening mammogram for malignant neoplasm of breast: Secondary | ICD-10-CM

## 2022-06-25 DIAGNOSIS — Z9189 Other specified personal risk factors, not elsewhere classified: Secondary | ICD-10-CM | POA: Diagnosis not present

## 2022-06-25 DIAGNOSIS — Z7251 High risk heterosexual behavior: Secondary | ICD-10-CM | POA: Diagnosis not present

## 2022-07-02 DIAGNOSIS — R634 Abnormal weight loss: Secondary | ICD-10-CM | POA: Diagnosis not present

## 2022-07-02 DIAGNOSIS — R7302 Impaired glucose tolerance (oral): Secondary | ICD-10-CM | POA: Diagnosis not present

## 2022-07-02 DIAGNOSIS — Z681 Body mass index (BMI) 19 or less, adult: Secondary | ICD-10-CM | POA: Diagnosis not present

## 2022-07-08 ENCOUNTER — Ambulatory Visit
Admission: RE | Admit: 2022-07-08 | Discharge: 2022-07-08 | Disposition: A | Discharge status: Home / Self Care | Payer: Medicare Other | Source: Ambulatory Visit | Attending: Family Medicine | Admitting: Family Medicine

## 2022-07-08 DIAGNOSIS — Z1231 Encounter for screening mammogram for malignant neoplasm of breast: Secondary | ICD-10-CM

## 2022-07-09 DIAGNOSIS — F332 Major depressive disorder, recurrent severe without psychotic features: Secondary | ICD-10-CM | POA: Diagnosis not present

## 2022-08-04 DIAGNOSIS — H5203 Hypermetropia, bilateral: Secondary | ICD-10-CM | POA: Diagnosis not present

## 2022-08-04 DIAGNOSIS — H40033 Anatomical narrow angle, bilateral: Secondary | ICD-10-CM | POA: Diagnosis not present

## 2022-08-04 DIAGNOSIS — H2513 Age-related nuclear cataract, bilateral: Secondary | ICD-10-CM | POA: Diagnosis not present

## 2022-08-04 DIAGNOSIS — H35371 Puckering of macula, right eye: Secondary | ICD-10-CM | POA: Diagnosis not present

## 2022-08-13 DIAGNOSIS — I10 Essential (primary) hypertension: Secondary | ICD-10-CM | POA: Diagnosis not present

## 2022-08-13 DIAGNOSIS — E782 Mixed hyperlipidemia: Secondary | ICD-10-CM | POA: Diagnosis not present

## 2022-08-13 DIAGNOSIS — F339 Major depressive disorder, recurrent, unspecified: Secondary | ICD-10-CM | POA: Diagnosis not present

## 2022-08-13 DIAGNOSIS — R634 Abnormal weight loss: Secondary | ICD-10-CM | POA: Diagnosis not present

## 2022-08-20 DIAGNOSIS — M79641 Pain in right hand: Secondary | ICD-10-CM | POA: Diagnosis not present

## 2022-08-26 DIAGNOSIS — F332 Major depressive disorder, recurrent severe without psychotic features: Secondary | ICD-10-CM | POA: Diagnosis not present

## 2022-09-02 DIAGNOSIS — G5601 Carpal tunnel syndrome, right upper limb: Secondary | ICD-10-CM | POA: Diagnosis not present

## 2022-09-23 DIAGNOSIS — F419 Anxiety disorder, unspecified: Secondary | ICD-10-CM | POA: Diagnosis not present

## 2022-09-23 DIAGNOSIS — F332 Major depressive disorder, recurrent severe without psychotic features: Secondary | ICD-10-CM | POA: Diagnosis not present

## 2022-10-19 DIAGNOSIS — F339 Major depressive disorder, recurrent, unspecified: Secondary | ICD-10-CM | POA: Diagnosis not present

## 2022-10-19 DIAGNOSIS — R7302 Impaired glucose tolerance (oral): Secondary | ICD-10-CM | POA: Diagnosis not present

## 2022-10-19 DIAGNOSIS — I10 Essential (primary) hypertension: Secondary | ICD-10-CM | POA: Diagnosis not present

## 2022-10-19 DIAGNOSIS — R7309 Other abnormal glucose: Secondary | ICD-10-CM | POA: Diagnosis not present

## 2022-10-19 DIAGNOSIS — Z6821 Body mass index (BMI) 21.0-21.9, adult: Secondary | ICD-10-CM | POA: Diagnosis not present

## 2022-10-19 DIAGNOSIS — F4381 Prolonged grief disorder: Secondary | ICD-10-CM | POA: Diagnosis not present

## 2022-10-19 DIAGNOSIS — L639 Alopecia areata, unspecified: Secondary | ICD-10-CM | POA: Diagnosis not present

## 2022-11-03 DIAGNOSIS — G5601 Carpal tunnel syndrome, right upper limb: Secondary | ICD-10-CM | POA: Diagnosis not present

## 2022-11-25 DIAGNOSIS — F419 Anxiety disorder, unspecified: Secondary | ICD-10-CM | POA: Diagnosis not present

## 2022-11-25 DIAGNOSIS — F332 Major depressive disorder, recurrent severe without psychotic features: Secondary | ICD-10-CM | POA: Diagnosis not present

## 2023-01-19 DIAGNOSIS — F419 Anxiety disorder, unspecified: Secondary | ICD-10-CM | POA: Diagnosis not present

## 2023-01-19 DIAGNOSIS — F332 Major depressive disorder, recurrent severe without psychotic features: Secondary | ICD-10-CM | POA: Diagnosis not present

## 2023-02-07 DIAGNOSIS — H40033 Anatomical narrow angle, bilateral: Secondary | ICD-10-CM | POA: Diagnosis not present

## 2023-02-07 DIAGNOSIS — H25813 Combined forms of age-related cataract, bilateral: Secondary | ICD-10-CM | POA: Diagnosis not present

## 2023-02-18 DIAGNOSIS — E782 Mixed hyperlipidemia: Secondary | ICD-10-CM | POA: Diagnosis not present

## 2023-02-18 DIAGNOSIS — F4381 Prolonged grief disorder: Secondary | ICD-10-CM | POA: Diagnosis not present

## 2023-02-18 DIAGNOSIS — R7309 Other abnormal glucose: Secondary | ICD-10-CM | POA: Diagnosis not present

## 2023-02-18 DIAGNOSIS — I10 Essential (primary) hypertension: Secondary | ICD-10-CM | POA: Diagnosis not present

## 2023-02-18 DIAGNOSIS — R011 Cardiac murmur, unspecified: Secondary | ICD-10-CM | POA: Diagnosis not present

## 2023-02-18 DIAGNOSIS — F32A Depression, unspecified: Secondary | ICD-10-CM | POA: Diagnosis not present

## 2023-03-04 DIAGNOSIS — F419 Anxiety disorder, unspecified: Secondary | ICD-10-CM | POA: Diagnosis not present

## 2023-03-04 DIAGNOSIS — F332 Major depressive disorder, recurrent severe without psychotic features: Secondary | ICD-10-CM | POA: Diagnosis not present

## 2023-03-17 DIAGNOSIS — Z1211 Encounter for screening for malignant neoplasm of colon: Secondary | ICD-10-CM | POA: Diagnosis not present

## 2023-03-17 DIAGNOSIS — Z86010 Personal history of colonic polyps: Secondary | ICD-10-CM | POA: Diagnosis not present

## 2023-03-17 DIAGNOSIS — R634 Abnormal weight loss: Secondary | ICD-10-CM | POA: Diagnosis not present

## 2023-03-17 DIAGNOSIS — R194 Change in bowel habit: Secondary | ICD-10-CM | POA: Diagnosis not present

## 2023-05-10 DIAGNOSIS — F332 Major depressive disorder, recurrent severe without psychotic features: Secondary | ICD-10-CM | POA: Diagnosis not present

## 2023-05-10 DIAGNOSIS — F419 Anxiety disorder, unspecified: Secondary | ICD-10-CM | POA: Diagnosis not present

## 2023-06-08 DIAGNOSIS — D123 Benign neoplasm of transverse colon: Secondary | ICD-10-CM | POA: Diagnosis not present

## 2023-06-08 DIAGNOSIS — Z1211 Encounter for screening for malignant neoplasm of colon: Secondary | ICD-10-CM | POA: Diagnosis not present

## 2023-06-08 DIAGNOSIS — K635 Polyp of colon: Secondary | ICD-10-CM | POA: Diagnosis not present

## 2023-06-08 DIAGNOSIS — R194 Change in bowel habit: Secondary | ICD-10-CM | POA: Diagnosis not present

## 2023-06-08 DIAGNOSIS — D122 Benign neoplasm of ascending colon: Secondary | ICD-10-CM | POA: Diagnosis not present

## 2023-06-21 DIAGNOSIS — Z6821 Body mass index (BMI) 21.0-21.9, adult: Secondary | ICD-10-CM | POA: Diagnosis not present

## 2023-06-21 DIAGNOSIS — I10 Essential (primary) hypertension: Secondary | ICD-10-CM | POA: Diagnosis not present

## 2023-06-21 DIAGNOSIS — L639 Alopecia areata, unspecified: Secondary | ICD-10-CM | POA: Diagnosis not present

## 2023-06-21 DIAGNOSIS — F32A Depression, unspecified: Secondary | ICD-10-CM | POA: Diagnosis not present

## 2023-06-21 DIAGNOSIS — E782 Mixed hyperlipidemia: Secondary | ICD-10-CM | POA: Diagnosis not present

## 2023-06-21 DIAGNOSIS — R7302 Impaired glucose tolerance (oral): Secondary | ICD-10-CM | POA: Diagnosis not present

## 2023-07-05 DIAGNOSIS — F332 Major depressive disorder, recurrent severe without psychotic features: Secondary | ICD-10-CM | POA: Diagnosis not present

## 2023-07-05 DIAGNOSIS — F419 Anxiety disorder, unspecified: Secondary | ICD-10-CM | POA: Diagnosis not present

## 2023-08-09 DIAGNOSIS — H40013 Open angle with borderline findings, low risk, bilateral: Secondary | ICD-10-CM | POA: Diagnosis not present

## 2023-08-09 DIAGNOSIS — H25813 Combined forms of age-related cataract, bilateral: Secondary | ICD-10-CM | POA: Diagnosis not present

## 2023-08-09 DIAGNOSIS — H5203 Hypermetropia, bilateral: Secondary | ICD-10-CM | POA: Diagnosis not present

## 2023-08-19 ENCOUNTER — Other Ambulatory Visit: Payer: Self-pay | Admitting: Family Medicine

## 2023-08-19 DIAGNOSIS — Z1231 Encounter for screening mammogram for malignant neoplasm of breast: Secondary | ICD-10-CM

## 2023-08-22 ENCOUNTER — Ambulatory Visit: Payer: Medicare Other

## 2023-08-22 DIAGNOSIS — Z1231 Encounter for screening mammogram for malignant neoplasm of breast: Secondary | ICD-10-CM | POA: Diagnosis not present

## 2023-08-30 DIAGNOSIS — F419 Anxiety disorder, unspecified: Secondary | ICD-10-CM | POA: Diagnosis not present

## 2023-08-30 DIAGNOSIS — F332 Major depressive disorder, recurrent severe without psychotic features: Secondary | ICD-10-CM | POA: Diagnosis not present

## 2023-11-22 DIAGNOSIS — R7302 Impaired glucose tolerance (oral): Secondary | ICD-10-CM | POA: Diagnosis not present

## 2023-11-22 DIAGNOSIS — Z6821 Body mass index (BMI) 21.0-21.9, adult: Secondary | ICD-10-CM | POA: Diagnosis not present

## 2023-11-22 DIAGNOSIS — F32A Depression, unspecified: Secondary | ICD-10-CM | POA: Diagnosis not present

## 2023-11-22 DIAGNOSIS — I10 Essential (primary) hypertension: Secondary | ICD-10-CM | POA: Diagnosis not present

## 2023-11-22 DIAGNOSIS — E782 Mixed hyperlipidemia: Secondary | ICD-10-CM | POA: Diagnosis not present

## 2023-11-28 ENCOUNTER — Other Ambulatory Visit: Payer: Self-pay | Admitting: Family Medicine

## 2023-11-28 DIAGNOSIS — F419 Anxiety disorder, unspecified: Secondary | ICD-10-CM | POA: Diagnosis not present

## 2023-11-28 DIAGNOSIS — Z13820 Encounter for screening for osteoporosis: Secondary | ICD-10-CM

## 2023-11-28 DIAGNOSIS — F332 Major depressive disorder, recurrent severe without psychotic features: Secondary | ICD-10-CM | POA: Diagnosis not present

## 2023-12-07 DIAGNOSIS — L603 Nail dystrophy: Secondary | ICD-10-CM | POA: Diagnosis not present

## 2023-12-07 DIAGNOSIS — L600 Ingrowing nail: Secondary | ICD-10-CM | POA: Diagnosis not present

## 2023-12-07 DIAGNOSIS — M792 Neuralgia and neuritis, unspecified: Secondary | ICD-10-CM | POA: Diagnosis not present

## 2023-12-07 DIAGNOSIS — M898X9 Other specified disorders of bone, unspecified site: Secondary | ICD-10-CM | POA: Diagnosis not present

## 2023-12-25 DIAGNOSIS — L603 Nail dystrophy: Secondary | ICD-10-CM | POA: Diagnosis not present

## 2024-01-07 ENCOUNTER — Other Ambulatory Visit: Payer: Self-pay

## 2024-01-07 ENCOUNTER — Emergency Department (HOSPITAL_COMMUNITY): Payer: Medicare Other

## 2024-01-07 ENCOUNTER — Encounter (HOSPITAL_COMMUNITY): Payer: Self-pay

## 2024-01-07 ENCOUNTER — Inpatient Hospital Stay (HOSPITAL_COMMUNITY)
Admission: EM | Admit: 2024-01-07 | Discharge: 2024-01-11 | DRG: 522 | Disposition: A | Discharge status: Skilled Nursing Facility | Payer: Medicare Other | Attending: Internal Medicine | Admitting: Internal Medicine

## 2024-01-07 DIAGNOSIS — Z833 Family history of diabetes mellitus: Secondary | ICD-10-CM

## 2024-01-07 DIAGNOSIS — E785 Hyperlipidemia, unspecified: Secondary | ICD-10-CM | POA: Diagnosis present

## 2024-01-07 DIAGNOSIS — S8991XA Unspecified injury of right lower leg, initial encounter: Secondary | ICD-10-CM | POA: Diagnosis not present

## 2024-01-07 DIAGNOSIS — D62 Acute posthemorrhagic anemia: Secondary | ICD-10-CM | POA: Diagnosis not present

## 2024-01-07 DIAGNOSIS — Z803 Family history of malignant neoplasm of breast: Secondary | ICD-10-CM

## 2024-01-07 DIAGNOSIS — M6282 Rhabdomyolysis: Secondary | ICD-10-CM | POA: Diagnosis present

## 2024-01-07 DIAGNOSIS — Y92009 Unspecified place in unspecified non-institutional (private) residence as the place of occurrence of the external cause: Secondary | ICD-10-CM | POA: Diagnosis not present

## 2024-01-07 DIAGNOSIS — Z9189 Other specified personal risk factors, not elsewhere classified: Secondary | ICD-10-CM | POA: Diagnosis not present

## 2024-01-07 DIAGNOSIS — W1831XA Fall on same level due to stepping on an object, initial encounter: Secondary | ICD-10-CM | POA: Diagnosis present

## 2024-01-07 DIAGNOSIS — R262 Difficulty in walking, not elsewhere classified: Secondary | ICD-10-CM | POA: Diagnosis not present

## 2024-01-07 DIAGNOSIS — Z79899 Other long term (current) drug therapy: Secondary | ICD-10-CM

## 2024-01-07 DIAGNOSIS — R0902 Hypoxemia: Secondary | ICD-10-CM | POA: Diagnosis not present

## 2024-01-07 DIAGNOSIS — R509 Fever, unspecified: Secondary | ICD-10-CM | POA: Diagnosis not present

## 2024-01-07 DIAGNOSIS — F17210 Nicotine dependence, cigarettes, uncomplicated: Secondary | ICD-10-CM | POA: Diagnosis present

## 2024-01-07 DIAGNOSIS — M6281 Muscle weakness (generalized): Secondary | ICD-10-CM | POA: Diagnosis not present

## 2024-01-07 DIAGNOSIS — L639 Alopecia areata, unspecified: Secondary | ICD-10-CM | POA: Diagnosis not present

## 2024-01-07 DIAGNOSIS — Z602 Problems related to living alone: Secondary | ICD-10-CM | POA: Diagnosis present

## 2024-01-07 DIAGNOSIS — I10 Essential (primary) hypertension: Secondary | ICD-10-CM | POA: Diagnosis not present

## 2024-01-07 DIAGNOSIS — Z471 Aftercare following joint replacement surgery: Secondary | ICD-10-CM | POA: Diagnosis not present

## 2024-01-07 DIAGNOSIS — M79604 Pain in right leg: Secondary | ICD-10-CM | POA: Diagnosis not present

## 2024-01-07 DIAGNOSIS — G5601 Carpal tunnel syndrome, right upper limb: Secondary | ICD-10-CM

## 2024-01-07 DIAGNOSIS — S72051A Unspecified fracture of head of right femur, initial encounter for closed fracture: Secondary | ICD-10-CM | POA: Diagnosis not present

## 2024-01-07 DIAGNOSIS — L659 Nonscarring hair loss, unspecified: Secondary | ICD-10-CM | POA: Diagnosis not present

## 2024-01-07 DIAGNOSIS — Z8249 Family history of ischemic heart disease and other diseases of the circulatory system: Secondary | ICD-10-CM

## 2024-01-07 DIAGNOSIS — E872 Acidosis, unspecified: Secondary | ICD-10-CM | POA: Diagnosis present

## 2024-01-07 DIAGNOSIS — G4700 Insomnia, unspecified: Secondary | ICD-10-CM | POA: Diagnosis not present

## 2024-01-07 DIAGNOSIS — M79641 Pain in right hand: Secondary | ICD-10-CM

## 2024-01-07 DIAGNOSIS — R079 Chest pain, unspecified: Secondary | ICD-10-CM | POA: Diagnosis not present

## 2024-01-07 DIAGNOSIS — S72001D Fracture of unspecified part of neck of right femur, subsequent encounter for closed fracture with routine healing: Secondary | ICD-10-CM | POA: Diagnosis not present

## 2024-01-07 DIAGNOSIS — F419 Anxiety disorder, unspecified: Secondary | ICD-10-CM | POA: Diagnosis present

## 2024-01-07 DIAGNOSIS — R001 Bradycardia, unspecified: Secondary | ICD-10-CM | POA: Diagnosis not present

## 2024-01-07 DIAGNOSIS — Z96641 Presence of right artificial hip joint: Secondary | ICD-10-CM | POA: Diagnosis not present

## 2024-01-07 DIAGNOSIS — S72001A Fracture of unspecified part of neck of right femur, initial encounter for closed fracture: Principal | ICD-10-CM | POA: Diagnosis present

## 2024-01-07 DIAGNOSIS — S72041A Displaced fracture of base of neck of right femur, initial encounter for closed fracture: Secondary | ICD-10-CM | POA: Diagnosis not present

## 2024-01-07 LAB — CBC WITH DIFFERENTIAL/PLATELET
Abs Immature Granulocytes: 0.06 10*3/uL (ref 0.00–0.07)
Basophils Absolute: 0 10*3/uL (ref 0.0–0.1)
Basophils Relative: 0 %
Eosinophils Absolute: 0 10*3/uL (ref 0.0–0.5)
Eosinophils Relative: 0 %
HCT: 44.1 % (ref 36.0–46.0)
Hemoglobin: 15 g/dL (ref 12.0–15.0)
Immature Granulocytes: 1 %
Lymphocytes Relative: 13 %
Lymphs Abs: 1.5 10*3/uL (ref 0.7–4.0)
MCH: 33.6 pg (ref 26.0–34.0)
MCHC: 34 g/dL (ref 30.0–36.0)
MCV: 98.9 fL (ref 80.0–100.0)
Monocytes Absolute: 0.9 10*3/uL (ref 0.1–1.0)
Monocytes Relative: 8 %
Neutro Abs: 8.6 10*3/uL — ABNORMAL HIGH (ref 1.7–7.7)
Neutrophils Relative %: 78 %
Platelets: 216 10*3/uL (ref 150–400)
RBC: 4.46 MIL/uL (ref 3.87–5.11)
RDW: 12.7 % (ref 11.5–15.5)
WBC: 11.1 10*3/uL — ABNORMAL HIGH (ref 4.0–10.5)
nRBC: 0 % (ref 0.0–0.2)

## 2024-01-07 LAB — COMPREHENSIVE METABOLIC PANEL
ALT: 58 U/L — ABNORMAL HIGH (ref 0–44)
AST: 92 U/L — ABNORMAL HIGH (ref 15–41)
Albumin: 4 g/dL (ref 3.5–5.0)
Alkaline Phosphatase: 55 U/L (ref 38–126)
Anion gap: 18 — ABNORMAL HIGH (ref 5–15)
BUN: 19 mg/dL (ref 8–23)
CO2: 18 mmol/L — ABNORMAL LOW (ref 22–32)
Calcium: 9.7 mg/dL (ref 8.9–10.3)
Chloride: 104 mmol/L (ref 98–111)
Creatinine, Ser: 0.86 mg/dL (ref 0.44–1.00)
GFR, Estimated: >60 mL/min (ref >60)
Glucose, Bld: 129 mg/dL — ABNORMAL HIGH (ref 70–99)
Potassium: 4.1 mmol/L (ref 3.5–5.1)
Sodium: 140 mmol/L (ref 135–145)
Total Bilirubin: 1.7 mg/dL — ABNORMAL HIGH (ref 0.0–1.2)
Total Protein: 7.3 g/dL (ref 6.5–8.1)

## 2024-01-07 LAB — CK: Total CK: 2436 U/L — ABNORMAL HIGH (ref 38–234)

## 2024-01-07 MED ORDER — SODIUM CHLORIDE 0.9 % IV BOLUS
1000.0000 mL | Freq: Once | INTRAVENOUS | Status: AC
  Administered 2024-01-07: 1000 mL via INTRAVENOUS

## 2024-01-07 MED ORDER — HYDROMORPHONE HCL 1 MG/ML IJ SOLN
0.5000 mg | Freq: Four times a day (QID) | INTRAMUSCULAR | Status: DC | PRN
  Administered 2024-01-08 – 2024-01-09 (×2): 0.5 mg via INTRAVENOUS
  Filled 2024-01-07 (×3): qty 0.5

## 2024-01-07 MED ORDER — SENNOSIDES-DOCUSATE SODIUM 8.6-50 MG PO TABS: 1.0000 | ORAL_TABLET | Freq: Every evening | ORAL | Status: DC | PRN

## 2024-01-07 MED ORDER — ACETAMINOPHEN 650 MG RE SUPP: 650.0000 mg | Freq: Four times a day (QID) | RECTAL | Status: DC | PRN

## 2024-01-07 MED ORDER — SPIRONOLACTONE 25 MG PO TABS
25.0000 mg | ORAL_TABLET | Freq: Every day | ORAL | Status: DC
Start: 2024-01-07 — End: 2024-01-08
  Administered 2024-01-07: 25 mg via ORAL
  Filled 2024-01-07: qty 1

## 2024-01-07 MED ORDER — OXYCODONE-ACETAMINOPHEN 5-325 MG PO TABS: 1.0000 | ORAL_TABLET | Freq: Four times a day (QID) | ORAL | Status: DC | PRN

## 2024-01-07 MED ORDER — ENOXAPARIN SODIUM 40 MG/0.4ML IJ SOSY
40.0000 mg | PREFILLED_SYRINGE | INTRAMUSCULAR | Status: DC
  Administered 2024-01-07 – 2024-01-08 (×2): 40 mg via SUBCUTANEOUS
  Filled 2024-01-07 (×2): qty 0.4

## 2024-01-07 MED ORDER — ONDANSETRON HCL 4 MG/2ML IJ SOLN
4.0000 mg | Freq: Four times a day (QID) | INTRAMUSCULAR | Status: DC | PRN
  Administered 2024-01-09: 4 mg via INTRAVENOUS

## 2024-01-07 MED ORDER — ONDANSETRON HCL 4 MG/2ML IJ SOLN
4.0000 mg | Freq: Once | INTRAMUSCULAR | Status: AC
  Administered 2024-01-07: 4 mg via INTRAVENOUS
  Filled 2024-01-07: qty 2

## 2024-01-07 MED ORDER — ONDANSETRON HCL 4 MG PO TABS: 4.0000 mg | ORAL_TABLET | Freq: Four times a day (QID) | ORAL | Status: DC | PRN

## 2024-01-07 MED ORDER — MINOXIDIL 2.5 MG PO TABS
2.5000 mg | ORAL_TABLET | Freq: Every day | ORAL | Status: DC
  Administered 2024-01-07 – 2024-01-11 (×5): 2.5 mg via ORAL
  Filled 2024-01-07 (×5): qty 1

## 2024-01-07 MED ORDER — MORPHINE SULFATE (PF) 4 MG/ML IV SOLN
4.0000 mg | Freq: Once | INTRAVENOUS | Status: AC
  Administered 2024-01-07: 4 mg via INTRAVENOUS
  Filled 2024-01-07: qty 1

## 2024-01-07 MED ORDER — ACETAMINOPHEN 325 MG PO TABS
650.0000 mg | ORAL_TABLET | Freq: Four times a day (QID) | ORAL | Status: DC | PRN
  Administered 2024-01-07: 650 mg via ORAL
  Filled 2024-01-07: qty 2

## 2024-01-07 MED ORDER — LABETALOL HCL 200 MG PO TABS
200.0000 mg | ORAL_TABLET | Freq: Once | ORAL | Status: AC
  Administered 2024-01-07: 200 mg via ORAL
  Filled 2024-01-07: qty 1

## 2024-01-07 NOTE — ED Notes (Signed)
 ED TO INPATIENT HANDOFF REPORT  ED Nurse Name and Phone #: Shelba [phone]  S Name/Age/Gender [name] 73 y.o. female Room/Bed: 046C/046C  Code Status   Code Status: Full Code  Home/SNF/Other Home Patient oriented to: self, place, time, and situation Is this baseline? Yes   Triage Complete: Triage complete  Chief Complaint Closed displaced fracture of right femoral neck (HCC) [S72.001A]  Triage Note Pt bib ems from home c/o fall. Pt states she has right leg pain above her knee that started Wednesday. Pt said yesterday she was on the couch and attempted to get up and ended on the floor. Pt isn't on thinners. Pt was on the floor since yesterday. Pt says she was on the floor d/t the pain. Pt brother alerted police to do a well fair check.  HR 88 BP 162/96 CBG 156 RA 98%   Allergies No Known Allergies  Level of Care/Admitting Diagnosis ED Disposition     ED Disposition  Admit   Condition  --   Comment  Hospital Area: [facility] [[id number]]  Level of Care: Med-Surg [16]  May admit patient to [facility] or [facility] if equivalent level of care is available:: No  Covid Evaluation: Asymptomatic - no recent exposure (last 10 days) testing not required  Diagnosis: Closed displaced fracture of right femoral neck [facility]) [[id number]]  Admitting Physician: LOU CLARETTA HERO [[id number]]  Attending Physician: LOU CLARETTA HERO [[id number]]  Certification:: I certify this patient will need inpatient services for at least 2 midnights  Expected Medical Readiness: 01/10/2024          B Medical/Surgery History Past Medical History:  Diagnosis Date   Anxiety    Breast cyst 1982   Depression    Eczema    Hypertension    Lipid disorder    Mood swings    Past Surgical History:  Procedure Laterality Date   BREAST EXCISIONAL BIOPSY Left 1981   CARPAL TUNNEL RELEASE  2007     A IV Location/Drains/Wounds Patient Lines/Drains/Airways Status      Active Line/Drains/Airways     Name Placement date Placement time Site Days   Peripheral IV 01/07/24 20 G Anterior;Left;Proximal Forearm 01/07/24  1252  Forearm  less than 1            Intake/Output Last 24 hours No intake or output data in the 24 hours ending 01/07/24 2016  Labs/Imaging Results for orders placed or performed during the hospital encounter of 01/07/24 (from the past 48 hours)  CBC with Differential     Status: Abnormal   Collection Time: 01/07/24 12:56 PM  Result Value Ref Range   WBC 11.1 (H) 4.0 - 10.5 K/uL   RBC 4.46 3.87 - 5.11 MIL/uL   Hemoglobin 15.0 12.0 - 15.0 g/dL   HCT 55.8 63.9 - 53.9 %   MCV 98.9 80.0 - 100.0 fL   MCH 33.6 26.0 - 34.0 pg   MCHC 34.0 30.0 - 36.0 g/dL   RDW 87.2 88.4 - 84.4 %   Platelets 216 150 - 400 K/uL   nRBC 0.0 0.0 - 0.2 %   Neutrophils Relative % 78 %   Neutro Abs 8.6 (H) 1.7 - 7.7 K/uL   Lymphocytes Relative 13 %   Lymphs Abs 1.5 0.7 - 4.0 K/uL   Monocytes Relative 8 %   Monocytes Absolute 0.9 0.1 - 1.0 K/uL   Eosinophils Relative 0 %   Eosinophils Absolute 0.0 0.0 - 0.5 K/uL   Basophils  Relative 0 %   Basophils Absolute 0.0 0.0 - 0.1 K/uL   Immature Granulocytes 1 %   Abs Immature Granulocytes 0.06 0.00 - 0.07 K/uL    Comment: Performed at [facility], [address], [place], [place] [address]  Comprehensive metabolic panel     Status: Abnormal   Collection Time: 01/07/24 12:56 PM  Result Value Ref Range   Sodium 140 135 - 145 mmol/L   Potassium 4.1 3.5 - 5.1 mmol/L   Chloride 104 98 - 111 mmol/L   CO2 18 (L) 22 - 32 mmol/L   Glucose, Bld 129 (H) 70 - 99 mg/dL    Comment: Glucose reference range applies only to samples taken after fasting for at least 8 hours.   BUN 19 8 - 23 mg/dL   Creatinine, Ser 9.13 0.44 - 1.00 mg/dL   Calcium 9.7 8.9 - 89.6 mg/dL   Total Protein 7.3 6.5 - 8.1 g/dL   Albumin 4.0 3.5 - 5.0 g/dL   AST 92 (H) 15 - 41 U/L   ALT 58 (H) 0 - 44 U/L   Alkaline Phosphatase 55 38 -  126 U/L   Total Bilirubin 1.7 (H) 0.0 - 1.2 mg/dL   GFR, Estimated >39 >39 mL/min    Comment: (NOTE) Calculated using the CKD-EPI Creatinine Equation (2021)    Anion gap 18 (H) 5 - 15    Comment: Performed at [facility], [address], [place], [place] [address]  CK     Status: Abnormal   Collection Time: 01/07/24 12:56 PM  Result Value Ref Range   Total CK 2,436 (H) 38 - 234 U/L    Comment: Performed at [facility], [address], [place], [place] [address]   ARCOLA BELTS, [place] 2 VIEWS RIGHT Result Date: 01/07/2024 CLINICAL DATA:  Fall today.  Right hip pain. EXAM: RIGHT FEMUR 2 VIEWS; PELVIS - 1-2 VIEW COMPARISON:  None Available. FINDINGS: There is diffuse decreased bone mineralization. Mild bilateral sacroiliac subchondral sclerosis. Mild right-greater-than-left femoroacetabular joint space narrowing. There is an acute fracture of the proximal right femoral head-neck junction with up to approximately 1.6 cm diastasis at the superior aspect of the fracture. There is approximately 1.5 cm proximal retraction of the distal fracture component with respect to the proximal fracture component. On lateral view there is moderate anterior apex angulation of the proximal aspect of the femoral neck at the distal fracture component. The right femoral head appears normally located within the right acetabulum. No knee joint effusion. IMPRESSION: Acute fracture of the proximal right femoral head-neck junction with up to 1.6 cm diastasis at the superior aspect of the fracture. There is approximately 1.5 cm proximal retraction of the distal fracture component with respect to the proximal fracture component. Electronically Signed   By: Tanda Lyons M.D.   On: 01/07/2024 15:13   DG Pelvis 1-2 Views Result Date: 01/07/2024 CLINICAL DATA:  Fall today.  Right hip pain. EXAM: RIGHT FEMUR 2 VIEWS; PELVIS - 1-2 VIEW COMPARISON:  None Available. FINDINGS: There is diffuse decreased bone mineralization. Mild  bilateral sacroiliac subchondral sclerosis. Mild right-greater-than-left femoroacetabular joint space narrowing. There is an acute fracture of the proximal right femoral head-neck junction with up to approximately 1.6 cm diastasis at the superior aspect of the fracture. There is approximately 1.5 cm proximal retraction of the distal fracture component with respect to the proximal fracture component. On lateral view there is moderate anterior apex angulation of the proximal aspect of  the femoral neck at the distal fracture component. The right femoral head appears normally located within the right acetabulum. No knee joint effusion. IMPRESSION: Acute fracture of the proximal right femoral head-neck junction with up to 1.6 cm diastasis at the superior aspect of the fracture. There is approximately 1.5 cm proximal retraction of the distal fracture component with respect to the proximal fracture component. Electronically Signed   By: Tanda Lyons M.D.   On: 01/07/2024 15:13   DG Chest 1 View Result Date: 01/07/2024 CLINICAL DATA:  Fall fall today right hip pain. EXAM: CHEST  1 VIEW COMPARISON:  None available FINDINGS: Cardiac silhouette and mediastinal contours are within normal limits. There is mild calcification within the aortic arch. The lungs are clear. No pleural effusion or pneumothorax. Mild multilevel degenerative disc changes of the thoracic spine. IMPRESSION: No acute cardiopulmonary disease process. Electronically Signed   By: Tanda Lyons M.D.   On: 01/07/2024 15:10    Pending Labs Unresulted Labs (From admission, onward)     Start     Ordered   01/08/24 0500  CBC  Tomorrow morning,   R        01/07/24 1711   01/08/24 0500  Comprehensive metabolic panel  Tomorrow morning,   R        01/07/24 1711   01/08/24 0500  CK  Tomorrow morning,   R        01/07/24 1711   01/08/24 0500  VITAMIN D  25 Hydroxy (Vit-D Deficiency, Fractures)  Tomorrow morning,   R        01/07/24 1711             Vitals/Pain Today's Vitals   01/07/24 1442 01/07/24 1543 01/07/24 1858 01/07/24 2009  BP:   (!) 142/39 (!) 124/54  Pulse:   79 77  Resp:   18 16  Temp:    98.4 F (36.9 C)  TempSrc:    Oral  SpO2:   99% 100%  Weight:      Height:      PainSc: 5  5       Isolation Precautions No active isolations  Medications Medications  enoxaparin  (LOVENOX ) injection 40 mg (40 mg Subcutaneous Given 01/07/24 1712)  acetaminophen  (TYLENOL ) tablet 650 mg (has no administration in time range)    Or  acetaminophen  (TYLENOL ) suppository 650 mg (has no administration in time range)  senna-docusate (Senokot-S) tablet 1 tablet (has no administration in time range)  ondansetron  (ZOFRAN ) tablet 4 mg (has no administration in time range)    Or  ondansetron  (ZOFRAN ) injection 4 mg (has no administration in time range)  spironolactone  (ALDACTONE ) tablet 25 mg (25 mg Oral Given 01/07/24 1712)  minoxidil  (LONITEN ) tablet 2.5 mg (2.5 mg Oral Given 01/07/24 1712)  HYDROmorphone  (DILAUDID ) injection 0.5 mg (has no administration in time range)  oxyCODONE -acetaminophen  (PERCOCET/ROXICET) 5-325 MG per tablet 1 tablet (has no administration in time range)  morphine  (PF) 4 MG/ML injection 4 mg (4 mg Intravenous Given 01/07/24 1338)  ondansetron  (ZOFRAN ) injection 4 mg (4 mg Intravenous Given 01/07/24 1337)  morphine  (PF) 4 MG/ML injection 4 mg (4 mg Intravenous Given 01/07/24 1445)  sodium chloride  0.9 % bolus 1,000 mL (0 mLs Intravenous Stopped 01/07/24 1544)  labetalol  (NORMODYNE ) tablet 200 mg (200 mg Oral Given 01/07/24 1712)  sodium chloride  0.9 % bolus 1,000 mL (0 mLs Intravenous Stopped 01/07/24 1859)    Mobility non-ambulatory     Focused Assessments Neuro Assessment Handoff:  Swallow screen pass?  Yes          Neuro Assessment:   Neuro Checks:      Has TPA been given? No If patient is a Neuro Trauma and patient is going to OR before floor call report to 4N Charge nurse: [phone] or  [phone]   R Recommendations: See Admitting Provider Note  Report given to:   Additional Notes: NPO MN

## 2024-01-07 NOTE — Plan of Care (Signed)

## 2024-01-07 NOTE — ED Notes (Signed)
 Patient transported to MRI

## 2024-01-07 NOTE — ED Triage Notes (Signed)
 Pt bib ems from home c/o fall. Pt states she has right leg pain above her knee that started Wednesday. Pt said yesterday she was on the couch and attempted to get up and ended on the floor. Pt isn't on thinners. Pt was on the floor since yesterday. Pt says she was on the floor d/t the pain. Pt brother alerted police to do a well fair check.  HR 88 BP 162/96 CBG 156 RA 98%

## 2024-01-07 NOTE — ED Notes (Signed)
 Pt has bruising on both knees and left upper lateral thigh

## 2024-01-07 NOTE — ED Notes (Signed)
 Pt would like to be transferred to [facility] if possible.

## 2024-01-07 NOTE — ED Notes (Signed)
 Pt stated she stepped on her puppies toys and slid. Then she fell on her right side Wednesday.

## 2024-01-07 NOTE — H&P (Signed)
 History and Physical    Patient: [name] FMW:[id number] DOB: Apr 03, 1951 DOA: 01/07/2024 DOS: the patient was seen and examined on 01/07/2024 PCP: Benjamine Aland, MD  Patient coming from: Home  Chief Complaint:  Chief Complaint  Patient presents with   Fall   HPI: [name] is a 73 y.o. female with medical history significant of anxiety and hypertension who presented to the ED via EMS for evaluation of right hip pain after recent fall. Reports that on Wednesday, she stepped on her puppy's toy, slipped and fell on her right side.  She had mild discomfort but was ultimately able to get up.  On Thursday, she started having some pain in her right hip and leg.  On Friday, she had difficulty walking.  She tried to get up from her couch and her right leg gave out so she ended up on the floor.  She was unable to get up so she laid on the floor until her brother came to visit her today.  She complains of a sharp right groin pain that radiates to her right leg mostly when she moves. She denies any headaches, recent falls, dizziness, chest pain, abdominal pain, fevers, weakness, numbness, nausea or vomiting. States she lives alone with her dog has been relatively healthy and independent.  ED course: Initial vitals were temp 97.6, RR 16, HR 64, BP 200/97, SpO2 100% on room air Labs show WBC 11.1, Hgb 15.0, platelet 216, sodium 140, K+ 4.1, bicarb 18, glucose 129, creatinine 0.86, AST/ALT 92/58, alk phos 55, bili 1.7, anion gap 18, CK 2436 Chest x-ray with no active disease X-ray of the pelvis and right femur shows acute fracture of the proximal right femoral head-neck junction with up to 1.6 cm diastasis at the superior aspect of the fracture Patient received IV morphine  4 mg and IV Zofran  4 mg Orthopedic surgery was consulted for evaluation TRH was consulted for admission  Review of Systems: As mentioned in the history of present illness. All other systems reviewed and are  negative. Past Medical History:  Diagnosis Date   Anxiety    Breast cyst 1982   Depression    Eczema    Hypertension    Lipid disorder    Mood swings    Past Surgical History:  Procedure Laterality Date   BREAST EXCISIONAL BIOPSY Left 1981   CARPAL TUNNEL RELEASE  2007   Social History:  reports that she has an unknown smoking status. She has never used smokeless tobacco. No history on file for alcohol use and drug use.  No Known Allergies  Family History  Problem Relation Age of Onset   Breast cancer Maternal Aunt    Hypertension Mother    Cancer Mother        cervical   Diabetes Mother    Hypertension Father    Diabetes Father    Diabetes Sister    Hypertension Sister    Diabetes Brother    Hypertension Brother     Prior to Admission medications   Medication Sig Start Date End Date Taking? Authorizing Provider  labetalol  (NORMODYNE ) 200 MG tablet Take 200 mg by mouth once.   Yes [provider]  minoxidil  (LONITEN ) 2.5 MG tablet Take 2.5 mg by mouth daily. 11/17/23  Yes [provider]  spironolactone  (ALDACTONE ) 25 MG tablet Take 25 mg by mouth daily.   Yes [provider]  buPROPion (WELLBUTRIN XL) 150 MG 24 hr tablet Take 150 mg by mouth daily. Patient not  taking: Reported on 01/07/2024 12/29/18   [provider]  buPROPion (WELLBUTRIN XL) 300 MG 24 hr tablet Take 300 mg by mouth daily. Patient not taking: Reported on 01/07/2024 06/13/20   [provider]    Physical Exam: Vitals:   01/07/24 1241 01/07/24 1335 01/07/24 1336 01/07/24 1345  BP: (!) 200/97 (!) 123/97  (!) 149/85  Pulse: 64  68 66  Resp: 16 19 17 19   Temp: 97.6 F (36.4 C)     TempSrc: Oral     SpO2: 100%  100% 100%  Weight:      Height:       General: Pleasant, well-appearing elderly woman laying in bed. No acute distress. HEENT: [place]/AT. Anicteric sclera. Dry mucous membrane CV: RRR. No murmurs, rubs, or gallops. No LE edema Pulmonary: Lungs CTAB.  Normal effort. No wheezing or rales. Abdominal: Soft, nontender, nondistended. Normal bowel sounds. MSK: Mild tenderness to palpation of the right hip and thigh. RLE slightly shortened.  Neurovascular intact distally. Skin: Warm and dry. Mild bruising of bilateral knees. Neuro: A&Ox3. Moves all extremities. Normal sensation to light touch. No focal deficit. Psych: Normal mood and affect  Data Reviewed: Labs show WBC 11.1, Hgb 15.0, platelet 216, sodium 140, K+ 4.1, bicarb 18, glucose 129, creatinine 0.86, AST/ALT 92/58, alk phos 55, bili 1.7, anion gap 18, CK 2436 Chest x-ray with no active disease X-ray of the pelvis and right femur shows acute fracture of the proximal right femoral head-neck junction with up to 1.6 cm diastasis at the superior aspect of the fracture   Assessment and Plan:  [name] is a 73 y.o. female with medical history significant of anxiety and hypertension who presented to the ED via EMS for evaluation of right hip pain after recent fall and found to have acute fracture of the right femoral neck  # Right femoral neck fracture # Mechanical fall Relatively healthy and independent elderly patient slipped and fell 3 days ago, presented today with right leg pain and difficulty with ambulation and found to have x-ray of the pelvis and the right femur showing acute fracture of the proximal right femoral head/neck junction. She endorses sharp pain in the right groin down to her right leg aggravated by movement. -Orthopedic surgery consulted, appreciate recs -Pain control with as needed Percocet and IV Dilaudid  -Check vitamin D  levels -Fall precautions -Will make n.p.o. at midnight for possible surgery tomorrow  # Mild rhabdomyolysis Patient reports being down since yesterday due to right leg pain and inability to ambulate. CK elevated to 2400. CMP also shows mild transaminitis and anion gap metabolic acidosis. Lab abnormalities all secondary to her  rhabdomyolysis. Status post 1 L IV NS in the ED. -Give additional IV NS 1 L bolus -Encourage oral hydration -Follow-up repeat CK and CMP tomorrow  # HTN BP initially elevated with SBP in the 200s on arrival. Improved with SBP down to the 140s. Home regimen includes spironolactone  25 mg daily and labetalol  200 mg daily. -Continue spironolactone  and labetalol   # Hx of hair loss -Continue minoxidil  2.5 mg daily   Advance Care Planning:   Code Status: Full Code   Consults: Orthopedic surgery  Family Communication: No family at bedside  Severity of Illness: The appropriate patient status for this patient is INPATIENT. Inpatient status is judged to be reasonable and necessary in order to provide the required intensity of service to ensure the patient's safety. The patient's presenting symptoms, physical exam findings, and initial radiographic and laboratory data  in the context of their chronic comorbidities is felt to place them at high risk for further clinical deterioration. Furthermore, it is not anticipated that the patient will be medically stable for discharge from the hospital within 2 midnights of admission.   * I certify that at the point of admission it is my clinical judgment that the patient will require inpatient hospital care spanning beyond 2 midnights from the point of admission due to high intensity of service, high risk for further deterioration and high frequency of surveillance required.*  Author: Claretta CHRISTELLA Alderman, MD 01/07/2024 4:46 PM  For on call review [email or web address].

## 2024-01-07 NOTE — ED Provider Notes (Signed)
 [name] EMERGENCY DEPARTMENT AT [facility] Provider Note   CSN: [id number] Arrival date & time: 01/07/24  1229     History  Chief Complaint  Patient presents with   [name] is a 73 y.o. female.  73 yo F with with a cc of R hip pain. Patient fell a couple days ago.  Complaining of pain to the right hip. Unable to walk.  Crawled to the couch and stayed there for a couple days until family found her today.  Denies other injury in the fall.  Denies cp, sob, abdominal pain.    Fall       Home Medications Prior to Admission medications   Medication Sig Start Date End Date Taking? Authorizing Provider  buPROPion (WELLBUTRIN XL) 150 MG 24 hr tablet TAKE 1 TABLET BY MOUTH DAILY FOR DEPRESSION 12/29/18   [provider]  buPROPion (WELLBUTRIN XL) 300 MG 24 hr tablet Take 300 mg by mouth daily. 06/13/20   [provider]  finasteride (PROPECIA) 1 MG tablet TK 1 T PO QD 12/31/18   [provider]  FLUAD 0.5 ML SUSY ADM 0.5ML IM UTD 11/10/18   [provider]  FLUoxetine (PROZAC) 40 MG capsule Take 40 mg by mouth daily.    [provider]  labetalol  (NORMODYNE ) 200 MG tablet labetalol  200 mg tablet  TK 2 TS PO BID    [provider]  sertraline (ZOLOFT) 50 MG tablet sertraline 50 mg tablet  TK 1 T PO QAM FOR DEPRESSION    [provider]  spironolactone  (ALDACTONE ) 25 MG tablet spironolactone  25 mg tablet    [provider]      Allergies    Patient has no known allergies.    Review of Systems   Review of Systems  Physical Exam Updated Vital Signs BP (!) 149/85   Pulse 66   Temp 97.6 F (36.4 C) (Oral)   Resp 19   Ht 5' 2 (1.575 m)   Wt 52.6 kg   SpO2 100%   BMI 21.22 kg/m  Physical Exam Vitals and nursing note reviewed.  Constitutional:      General: She is not in acute distress.    Appearance: She is well-developed. She is not diaphoretic.  HENT:     Head:  Normocephalic and atraumatic.  Eyes:     Pupils: Pupils are equal, round, and reactive to light.  Cardiovascular:     Rate and Rhythm: Normal rate and regular rhythm.     Heart sounds: No murmur heard.    No friction rub. No gallop.  Pulmonary:     Effort: Pulmonary effort is normal.     Breath sounds: No wheezing or rales.  Abdominal:     General: There is no distension.     Palpations: Abdomen is soft.     Tenderness: There is no abdominal tenderness.  Musculoskeletal:        General: Tenderness present.     Cervical back: Normal range of motion and neck supple.     Comments: Pain with internal/external rotation of the right leg.  Pulse motor and sensation intact distally.  Some bruising to the knees bilaterally.  No obvious midline spinal tenderness step-offs or deformities.  Palpated from head to toe without any other obvious noted areas of bony tenderness.  Skin:    General: Skin is warm and dry.  Neurological:     Mental Status: She is alert and oriented  to person, place, and time.  Psychiatric:        Behavior: Behavior normal.     ED Results / Procedures / Treatments   Labs (all labs ordered are listed, but only abnormal results are displayed) Labs Reviewed  CBC WITH DIFFERENTIAL/PLATELET - Abnormal; Notable for the following components:      Result Value   WBC 11.1 (*)    Neutro Abs 8.6 (*)    All other components within normal limits  COMPREHENSIVE METABOLIC PANEL - Abnormal; Notable for the following components:   CO2 18 (*)    Glucose, Bld 129 (*)    AST 92 (*)    ALT 58 (*)    Total Bilirubin 1.7 (*)    Anion gap 18 (*)    All other components within normal limits  CK - Abnormal; Notable for the following components:   Total CK 2,436 (*)    All other components within normal limits    EKG None  Radiology DG Chest 1 View Result Date: 01/07/2024 CLINICAL DATA:  Fall fall today right hip pain. EXAM: CHEST  1 VIEW COMPARISON:  None available FINDINGS:  Cardiac silhouette and mediastinal contours are within normal limits. There is mild calcification within the aortic arch. The lungs are clear. No pleural effusion or pneumothorax. Mild multilevel degenerative disc changes of the thoracic spine. IMPRESSION: No acute cardiopulmonary disease process. Electronically Signed   By: Tanda Lyons M.D.   On: 01/07/2024 15:10    Procedures Procedures    Medications Ordered in ED Medications  morphine  (PF) 4 MG/ML injection 4 mg (4 mg Intravenous Given 01/07/24 1338)  ondansetron  (ZOFRAN ) injection 4 mg (4 mg Intravenous Given 01/07/24 1337)  morphine  (PF) 4 MG/ML injection 4 mg (4 mg Intravenous Given 01/07/24 1445)  sodium chloride  0.9 % bolus 1,000 mL (1,000 mLs Intravenous New Bag/Given 01/07/24 1449)    ED Course/ Medical Decision Making/ A&P                                 Medical Decision Making Amount and/or Complexity of Data Reviewed Labs: ordered. Radiology: ordered.  Risk Prescription drug management. Decision regarding hospitalization.   73 yo F with a chief complaints of a fall.  Nonsyncopal by history complaining of right hip pain.  Has been able to walk since.  Happened a couple days ago actually spent about half a day on the floor and then was able to crawl to the couch.  Her CK is a bit elevated.  Will give a bolus of IV fluids.  Patient has a femoral neck fracture on plain film of the right hip on my independent to rotation.  I discussed this with orthopedics Dr. Sherida.  Recommend medical admission.   The patients results and plan were reviewed and discussed.   Any x-rays performed were independently reviewed by myself.   Differential diagnosis were considered with the presenting HPI.  Medications  morphine  (PF) 4 MG/ML injection 4 mg (4 mg Intravenous Given 01/07/24 1338)  ondansetron  (ZOFRAN ) injection 4 mg (4 mg Intravenous Given 01/07/24 1337)  morphine  (PF) 4 MG/ML injection 4 mg (4 mg Intravenous Given 01/07/24 1445)   sodium chloride  0.9 % bolus 1,000 mL (1,000 mLs Intravenous New Bag/Given 01/07/24 1449)    Vitals:   01/07/24 1241 01/07/24 1335 01/07/24 1336 01/07/24 1345  BP: (!) 200/97 (!) 123/97  (!) 149/85  Pulse: 64  68 66  Resp: 16 19 17 19   Temp: 97.6 F (36.4 C)     TempSrc: Oral     SpO2: 100%  100% 100%  Weight:      Height:        Final diagnoses:  Closed displaced fracture of right femoral neck (HCC)    Admission/ observation were discussed with the admitting physician, patient and/or family and they are comfortable with the plan.         Final Clinical Impression(s) / ED Diagnoses Final diagnoses:  Closed displaced fracture of right femoral neck [facility])    Rx / DC Orders ED Discharge Orders     None         Emil Share, DO 01/07/24 1513

## 2024-01-08 DIAGNOSIS — S72001A Fracture of unspecified part of neck of right femur, initial encounter for closed fracture: Secondary | ICD-10-CM | POA: Diagnosis not present

## 2024-01-08 LAB — CBC
HCT: 35.9 % — ABNORMAL LOW (ref 36.0–46.0)
Hemoglobin: 12.2 g/dL (ref 12.0–15.0)
MCH: 33.5 pg (ref 26.0–34.0)
MCHC: 34 g/dL (ref 30.0–36.0)
MCV: 98.6 fL (ref 80.0–100.0)
Platelets: 181 10*3/uL (ref 150–400)
RBC: 3.64 MIL/uL — ABNORMAL LOW (ref 3.87–5.11)
RDW: 12.8 % (ref 11.5–15.5)
WBC: 8.9 10*3/uL (ref 4.0–10.5)
nRBC: 0 % (ref 0.0–0.2)

## 2024-01-08 LAB — COMPREHENSIVE METABOLIC PANEL
ALT: 40 U/L (ref 0–44)
AST: 57 U/L — ABNORMAL HIGH (ref 15–41)
Albumin: 2.9 g/dL — ABNORMAL LOW (ref 3.5–5.0)
Alkaline Phosphatase: 40 U/L (ref 38–126)
Anion gap: 10 (ref 5–15)
BUN: 19 mg/dL (ref 8–23)
CO2: 21 mmol/L — ABNORMAL LOW (ref 22–32)
Calcium: 8.5 mg/dL — ABNORMAL LOW (ref 8.9–10.3)
Chloride: 107 mmol/L (ref 98–111)
Creatinine, Ser: 0.69 mg/dL (ref 0.44–1.00)
GFR, Estimated: >60 mL/min (ref >60)
Glucose, Bld: 111 mg/dL — ABNORMAL HIGH (ref 70–99)
Potassium: 3.7 mmol/L (ref 3.5–5.1)
Sodium: 138 mmol/L (ref 135–145)
Total Bilirubin: 1 mg/dL (ref 0.0–1.2)
Total Protein: 5.4 g/dL — ABNORMAL LOW (ref 6.5–8.1)

## 2024-01-08 LAB — VITAMIN D 25 HYDROXY (VIT D DEFICIENCY, FRACTURES): Vit D, 25-Hydroxy: 30.91 ng/mL (ref 30–100)

## 2024-01-08 LAB — CK: Total CK: 1231 U/L — ABNORMAL HIGH (ref 38–234)

## 2024-01-08 MED ORDER — OXYCODONE HCL 5 MG PO TABS
5.0000 mg | ORAL_TABLET | Freq: Four times a day (QID) | ORAL | Status: DC | PRN
  Administered 2024-01-08 – 2024-01-09 (×2): 5 mg via ORAL
  Filled 2024-01-08 (×2): qty 1

## 2024-01-08 MED ORDER — ACETAMINOPHEN 500 MG PO TABS
1000.0000 mg | ORAL_TABLET | Freq: Three times a day (TID) | ORAL | Status: DC
  Administered 2024-01-08 – 2024-01-09 (×4): 1000 mg via ORAL
  Filled 2024-01-08 (×4): qty 2

## 2024-01-08 MED ORDER — SODIUM CHLORIDE 0.9 % IV SOLN: INTRAVENOUS | Status: DC

## 2024-01-08 MED ORDER — SENNOSIDES-DOCUSATE SODIUM 8.6-50 MG PO TABS
1.0000 | ORAL_TABLET | Freq: Every day | ORAL | Status: DC
Start: 2024-01-08 — End: 2024-01-10
  Administered 2024-01-08 – 2024-01-09 (×2): 1 via ORAL
  Filled 2024-01-08 (×2): qty 1

## 2024-01-08 MED ORDER — POLYETHYLENE GLYCOL 3350 17 G PO PACK: 17.0000 g | PACK | Freq: Every day | ORAL | Status: DC | PRN

## 2024-01-08 NOTE — Progress Notes (Signed)
 Transition of Care [facility]) - CAGE-AID Screening   Patient Details  Name: [name] MRN: [id number] Date of Birth: [birth year]  Transition of Care [facility]) CM/SW Contact:    Bernardino Mayotte, RN Phone Number: 01/08/2024, 7:34 PM   Clinical Narrative:  Patient denies use of alcohol and illicit substances. Resources not given at this time.   CAGE-AID Screening:    Have You Ever Felt You Ought to Cut Down on Your Drinking or Drug Use?: No Have People Annoyed You By Critizing Your Drinking Or Drug Use?: No Have You Felt Bad Or Guilty About Your Drinking Or Drug Use?: No Have You Ever Had a Drink or Used Drugs First Thing In The Morning to Steady Your Nerves or to Get Rid of a Hangover?: No CAGE-AID Score: 0  Substance Abuse Education Offered: No

## 2024-01-08 NOTE — Progress Notes (Signed)
 PROGRESS NOTE  [name]  DOB: 1951-08-31  PCP: Benjamine Aland, MD FMW:[id number]  DOA: 01/07/2024  LOS: 1 day  Hospital Day: 2  Brief narrative: [name] is a 73 y.o. female with PMH significant for HTN, HLD, anxiety/depression. Patient lives alone with her dog and at baseline relatively healthy and independent. 1/11, patient presented to the ED for evaluation of right hip pain after a recent fall.  On 1/8, she accidentally stepped on her puppies toy, slipped and fell on her right side. She had mild discomfort but was ultimately able to get up.  Pain gradually progressed and on 1/10 when she tried to get up from her couch, her right leg gave out she ended up on the floor.  She was unable to get up until the next day when her brother came to visit her and was ultimately brought to ED.  In the ED, patient was afebrile, initial blood pressure over 200/97, breathing on room air Labs with CK more than 2400, renal function normal Chest x-ray with no active disease X-ray of the pelvis and right femur shows acute fracture of the proximal right femoral head-neck junction with up to 1.6 cm diastasis at the superior aspect of the fracture Patient received IV morphine  4 mg and IV Zofran  4 mg Orthopedic surgery was consulted for evaluation Admitted to [facility]  Subjective: Patient was seen and examined this morning.  pleasant elderly female.  Not in distress. Chart reviewed Patient had 2 episodes of low-grade fever 100.5 last night.  Blood pressure in 130s. CK level improved to 1200. Pelvic x-ray and right femur x-ray showed acute fracture of the proximal right femoral head-neck junction with up to 1.6 cm diastasis at the superior aspect of the fracture. There is approximately 1.5 cm proximal retraction of the distal fracture component with respect to the proximal fracture component.   Assessment and plan: Right femoral neck fracture Secondary to mechanical fall 3 days  prior Imaging as above  Orthopedic consulted  Pending surgical intervention.  I spoke with orthopedics Dr. Sherida this morning.  Tentative plan of surgical fixation tomorrow.  Diet and n.p.o. after midnight ordered For pain control, will start on scheduled Tylenol , as needed oxycodone  and IV Dilaudid  as needed Bowel regimen with scheduled Senokot and as needed MiraLAX  DVT prophylaxis with Lovenox  subcu Check vitamin D  levels Fall precautions  Low-grade fever 2 episodes of temperature 100.5 last night.  No clear evidence of infection.  Not on antibiotics. Continue to monitor Recent Labs  Lab 01/07/24 1256 01/08/24 0445  WBC 11.1* 8.9   Rhabdomyolysis Due to being on the floor for several hours. Continue monitor on IV fluids.  Currently on NS at 75 mL/h Recent Labs  Lab 01/07/24 1256 01/08/24 0445  CKTOTAL 2,436* 1,231*    Essential HTN BP was initially elevated with SBP in the 200s on arrival, likely due to pain Improving gradually. PTA meds- labetalol  200 mg daily and spironolactone  25 mg daily  Continue labetalol .  Keep Aldactone  on hold while getting IV fluid   Hx of hair loss Continue minoxidil  2.5 mg daily   Mobility: Needs PT eval postprocedure  Goals of care   Code Status: Full Code     DVT prophylaxis:  enoxaparin  (LOVENOX ) injection 40 mg Start: 01/07/24 1600   Antimicrobials: None Fluid: NS at 75 mL/h Consultants: Orthopedics Family Communication: None at bedside  Status: Inpatient Level of care:  Med-Surg   Patient is from: Home Needs to continue in-hospital care:  Pending surgery Anticipated d/c to: Pending surgery      Diet:  Diet Order             Diet NPO time specified  Diet effective midnight           Diet 2 gram sodium Room service appropriate? Yes; Fluid consistency: Thin  Diet effective now                   Scheduled Meds:  acetaminophen   1,000 mg Oral TID   enoxaparin  (LOVENOX ) injection  40 mg Subcutaneous Q24H    minoxidil   2.5 mg Oral Daily   senna-docusate  1 tablet Oral QHS    PRN meds: HYDROmorphone  (DILAUDID ) injection, ondansetron  **OR** ondansetron  (ZOFRAN ) IV, oxyCODONE , polyethylene glycol   Infusions:   sodium chloride  75 mL/hr at 01/08/24 1007    Antimicrobials: Anti-infectives (From admission, onward)    None       Objective: Vitals:   01/08/24 0324 01/08/24 0732  BP: (!) 126/56 (!) 130/59  Pulse: 70 71  Resp: 19 18  Temp: 98.2 F (36.8 C) 98.6 F (37 C)  SpO2: 99% 100%   No intake or output data in the 24 hours ending 01/08/24 1413 Filed Weights   01/07/24 1240  Weight: 52.6 kg   Weight change:  Body mass index is 21.22 kg/m.   Physical Exam: General exam: Pleasant, not in distress.  Pain controlled at rest Skin: No rashes, lesions or ulcers. HEENT: Atraumatic, normocephalic, no obvious bleeding Lungs: Clear to auscultation bilaterally,  CVS: S1, S2, no murmur,   GI/Abd: Soft, nontender, nondistended, bowel sound present,   CNS: Alert, awake, oriented x 3 Psychiatry: Mood appropriate,  Extremities: No pedal edema, no calf tenderness,   Data Review: I have personally reviewed the laboratory data and studies available.  F/u labs  Unresulted Labs (From admission, onward)     Start     Ordered   01/09/24 0500  Basic metabolic panel  Tomorrow morning,   R       Question:  Specimen collection method  Answer:  Lab=Lab collect   01/08/24 1413   01/09/24 0500  CBC with Differential/Platelet  Tomorrow morning,   R       Question:  Specimen collection method  Answer:  Lab=Lab collect   01/08/24 1413            Total time spent in review of labs and imaging, patient evaluation, formulation of plan, documentation and communication with family: 55 minutes  Signed, Chapman Rota, MD [facility] 01/08/2024

## 2024-01-08 NOTE — Consult Note (Signed)
 [name]  ORTHOPAEDIC CONSULTATION  REQUESTING PHYSICIAN: DahalChapman, MD  ASSESSMENT AND PLAN: 73 y.o. female with the following: Right Hip Displaced femoral neck fracture  This patient requires inpatient admission to the hospitalist, to include preoperative clearance and perioperative medical management  - Weight Bearing Status/Activity: NWB Right lower extremity  - Additional recommended labs/tests:   -VTE Prophylaxis: Please hold prior to OR; to resume POD#1 at the discretion of the primary team  - Pain control: Recommend PO pain medications PRN; judicious use of narcotics  -Procedures: Plan for OR once patient has been medically optimized  Plan for Right Total hip arthroplasty tomorrow.  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 female who presented to the ED for evaluation after sustaining a mechanical fall on Wednesday, 01/03/2023.  She states she slipped on a puppy toy and fell onto her right side she.  She initially had some mild pain in her hip and leg and attempted to ambulate but stated on Friday her leg gave out and she had to crawl to the couch.  She is complaining of Right hip pain.  Denies any numbness or tingling.  Of note, patient ate some fruit for breakfast this AM per nursing staff   Past Medical History:  Diagnosis Date   Anxiety    Breast cyst 1982   Depression    Eczema    Hypertension    Lipid disorder    Mood swings    Past Surgical History:  Procedure Laterality Date   BREAST EXCISIONAL BIOPSY Left 1981   CARPAL TUNNEL RELEASE  2007   Social History   Socioeconomic History   Marital status: Divorced    Spouse name: Not on file   Number of children: Not on file   Years of education: Not on file   Highest education level: Not on file  Occupational History   Not on file   Tobacco Use   Smoking status: Unknown   Smokeless tobacco: Never  Substance and Sexual Activity   Alcohol use: Not on file   Drug use: Not on file   Sexual activity: Not on file  Other Topics Concern   Not on file  Social History Narrative   Not on file   Social Drivers of Health   Financial Resource Strain: Not on file  Food Insecurity: No Food Insecurity (01/07/2024)   Hunger Vital Sign    Worried About Running Out of Food in the Last Year: Never true    Ran Out of Food in the Last Year: Never true  Transportation Needs: No Transportation Needs (01/07/2024)   PRAPARE - Administrator, Civil Service (Medical): No    Lack of Transportation (Non-Medical): No  Physical Activity: Not on file  Stress: Not on file  Social Connections: Moderately Integrated (01/07/2024)   Social Connection and Isolation Panel [NHANES]    Frequency of Communication with Friends and Family: More than three times a week    Frequency of Social Gatherings with Friends and Family: Once a week    Attends Religious Services: More than 4 times per year    Active Member of [employer] or Organizations: Yes    Attends Engineer, Structural: More than 4 times per year    Marital Status: Divorced   Family History  Problem Relation  Age of Onset   Breast cancer Maternal Aunt    Hypertension Mother    Cancer Mother        cervical   Diabetes Mother    Hypertension Father    Diabetes Father    Diabetes Sister    Hypertension Sister    Diabetes Brother    Hypertension Brother    No Known Allergies Prior to Admission medications   Medication Sig Start Date End Date Taking? Authorizing Provider  labetalol  (NORMODYNE ) 200 MG tablet Take 200 mg by mouth once.   Yes [provider]  minoxidil  (LONITEN ) 2.5 MG tablet Take 2.5 mg by mouth daily. 11/17/23  Yes [provider]  spironolactone  (ALDACTONE ) 25 MG tablet Take 25 mg by mouth daily.   Yes [provider]   buPROPion (WELLBUTRIN XL) 150 MG 24 hr tablet Take 150 mg by mouth daily. Patient not taking: Reported on 01/07/2024 12/29/18   [provider]  buPROPion (WELLBUTRIN XL) 300 MG 24 hr tablet Take 300 mg by mouth daily. Patient not taking: Reported on 01/07/2024 06/13/20   [provider]    Family History Reviewed and non-contributory, no pertinent history of problems with bleeding or anesthesia    Review of Systems No fevers or chills No numbness or tingling No chest pain No shortness of breath No bowel or bladder dysfunction No GI distress No headaches    OBJECTIVE  Vitals:Patient Vitals for the past 8 hrs:  BP Temp Temp src Pulse Resp SpO2  01/08/24 0732 (!) 130/59 98.6 F (37 C) Oral 71 18 100 %  01/08/24 0324 (!) 126/56 98.2 F (36.8 C) Oral 70 19 99 %   General: Alert, no acute distress Cardiovascular: Extremities are warm Respiratory: No cyanosis, no use of accessory musculature Skin: No lesions in the area of chief complaint  Neurologic: Sensation intact distally  Psychiatric: Patient is competent for consent with normal mood and affect Lymphatic: No swelling obvious and reported other than the area involved in the exam below Extremities  Right LE: Extremity held in a fixed position.  ROM deferred due to known fracture.  Sensation is intact distally in the sural, saphenous, DP, SP, and plantar nerve distribution. 2+ DP pulse.  Toes are WWP.  Active motion intact in the TA/EHL/GS. Left LE: Sensation is intact distally in the sural, saphenous, DP, SP, and plantar nerve distribution. 2+ DP pulse.  Toes are WWP.  Active motion intact in the TA/EHL/GS. Tolerates gentle ROM of the hip.  No pain with axial loading.     Test Results Imaging DG FEMUR, MIN 2 VIEWS RIGHT Result Date: 01/07/2024 CLINICAL DATA:  Fall today.  Right hip pain. EXAM: RIGHT FEMUR 2 VIEWS; PELVIS - 1-2 VIEW COMPARISON:  None Available. FINDINGS: There is diffuse decreased bone  mineralization. Mild bilateral sacroiliac subchondral sclerosis. Mild right-greater-than-left femoroacetabular joint space narrowing. There is an acute fracture of the proximal right femoral head-neck junction with up to approximately 1.6 cm diastasis at the superior aspect of the fracture. There is approximately 1.5 cm proximal retraction of the distal fracture component with respect to the proximal fracture component. On lateral view there is moderate anterior apex angulation of the proximal aspect of the femoral neck at the distal fracture component. The right femoral head appears normally located within the right acetabulum. No knee joint effusion. IMPRESSION: Acute fracture of the proximal right femoral head-neck junction with up to 1.6 cm diastasis at the superior aspect of the fracture. There is approximately  1.5 cm proximal retraction of the distal fracture component with respect to the proximal fracture component. Electronically Signed   By: Tanda Lyons M.D.   On: 01/07/2024 15:13   DG Pelvis 1-2 Views Result Date: 01/07/2024 CLINICAL DATA:  Fall today.  Right hip pain. EXAM: RIGHT FEMUR 2 VIEWS; PELVIS - 1-2 VIEW COMPARISON:  None Available. FINDINGS: There is diffuse decreased bone mineralization. Mild bilateral sacroiliac subchondral sclerosis. Mild right-greater-than-left femoroacetabular joint space narrowing. There is an acute fracture of the proximal right femoral head-neck junction with up to approximately 1.6 cm diastasis at the superior aspect of the fracture. There is approximately 1.5 cm proximal retraction of the distal fracture component with respect to the proximal fracture component. On lateral view there is moderate anterior apex angulation of the proximal aspect of the femoral neck at the distal fracture component. The right femoral head appears normally located within the right acetabulum. No knee joint effusion. IMPRESSION: Acute fracture of the proximal right femoral head-neck  junction with up to 1.6 cm diastasis at the superior aspect of the fracture. There is approximately 1.5 cm proximal retraction of the distal fracture component with respect to the proximal fracture component. Electronically Signed   By: Tanda Lyons M.D.   On: 01/07/2024 15:13   DG Chest 1 View Result Date: 01/07/2024 CLINICAL DATA:  Fall fall today right hip pain. EXAM: CHEST  1 VIEW COMPARISON:  None available FINDINGS: Cardiac silhouette and mediastinal contours are within normal limits. There is mild calcification within the aortic arch. The lungs are clear. No pleural effusion or pneumothorax. Mild multilevel degenerative disc changes of the thoracic spine. IMPRESSION: No acute cardiopulmonary disease process. Electronically Signed   By: Tanda Lyons M.D.   On: 01/07/2024 15:10   Labs cbc Recent Labs    01/07/24 1256 01/08/24 0445  WBC 11.1* 8.9  HGB 15.0 12.2  HCT 44.1 35.9*  PLT 216 181    Labs inflam No results for input(s): CRP in the last 72 hours.  Invalid input(s): ESR  Labs coag No results for input(s): INR, PTT in the last 72 hours.  Invalid input(s): PT  Recent Labs    01/07/24 1256 01/08/24 0445  NA 140 138  K 4.1 3.7  CL 104 107  CO2 18* 21*  GLUCOSE 129* 111*  BUN 19 19  CREATININE 0.86 0.69  CALCIUM 9.7 8.5*

## 2024-01-08 NOTE — Plan of Care (Signed)
  Problem: Pain Management: Goal: General experience of comfort will improve Outcome: Progressing   Problem: Skin Integrity: Goal: Risk for impaired skin integrity will decrease Outcome: Progressing

## 2024-01-08 NOTE — Progress Notes (Deleted)
 Went to introduce myself to the patient at the beginning of the shift,patient said she does not want me to be her nurse because she doesn't  like and trust me. Charge nurse notified ,who then got another RN to give her night medications,patient took meds with no objection.Unable to perform shift assessment due to patient refusal.

## 2024-01-09 ENCOUNTER — Inpatient Hospital Stay (HOSPITAL_COMMUNITY): Payer: Medicare Other | Admitting: Anesthesiology

## 2024-01-09 ENCOUNTER — Inpatient Hospital Stay (HOSPITAL_COMMUNITY): Payer: Medicare Other

## 2024-01-09 ENCOUNTER — Encounter (HOSPITAL_COMMUNITY): Payer: Self-pay | Admitting: Student

## 2024-01-09 ENCOUNTER — Other Ambulatory Visit: Payer: Self-pay

## 2024-01-09 ENCOUNTER — Encounter (HOSPITAL_COMMUNITY)
Admission: EM | Disposition: A | Discharge status: Skilled Nursing Facility | Payer: Self-pay | Source: Home / Self Care | Attending: Internal Medicine

## 2024-01-09 DIAGNOSIS — S72041A Displaced fracture of base of neck of right femur, initial encounter for closed fracture: Secondary | ICD-10-CM

## 2024-01-09 DIAGNOSIS — S72001A Fracture of unspecified part of neck of right femur, initial encounter for closed fracture: Secondary | ICD-10-CM | POA: Diagnosis not present

## 2024-01-09 HISTORY — PX: TOTAL HIP ARTHROPLASTY: SHX124

## 2024-01-09 LAB — BASIC METABOLIC PANEL
Anion gap: 8 (ref 5–15)
BUN: 7 mg/dL — ABNORMAL LOW (ref 8–23)
CO2: 23 mmol/L (ref 22–32)
Calcium: 8.3 mg/dL — ABNORMAL LOW (ref 8.9–10.3)
Chloride: 104 mmol/L (ref 98–111)
Creatinine, Ser: 0.65 mg/dL (ref 0.44–1.00)
GFR, Estimated: >60 mL/min (ref >60)
Glucose, Bld: 103 mg/dL — ABNORMAL HIGH (ref 70–99)
Potassium: 3.6 mmol/L (ref 3.5–5.1)
Sodium: 135 mmol/L (ref 135–145)

## 2024-01-09 LAB — CBC WITH DIFFERENTIAL/PLATELET
Abs Immature Granulocytes: 0.02 10*3/uL (ref 0.00–0.07)
Basophils Absolute: 0 10*3/uL (ref 0.0–0.1)
Basophils Relative: 1 %
Eosinophils Absolute: 0.2 10*3/uL (ref 0.0–0.5)
Eosinophils Relative: 2 %
HCT: 35.1 % — ABNORMAL LOW (ref 36.0–46.0)
Hemoglobin: 11.9 g/dL — ABNORMAL LOW (ref 12.0–15.0)
Immature Granulocytes: 0 %
Lymphocytes Relative: 32 %
Lymphs Abs: 2.2 10*3/uL (ref 0.7–4.0)
MCH: 33.7 pg (ref 26.0–34.0)
MCHC: 33.9 g/dL (ref 30.0–36.0)
MCV: 99.4 fL (ref 80.0–100.0)
Monocytes Absolute: 0.7 10*3/uL (ref 0.1–1.0)
Monocytes Relative: 10 %
Neutro Abs: 3.8 10*3/uL (ref 1.7–7.7)
Neutrophils Relative %: 55 %
Platelets: 178 10*3/uL (ref 150–400)
RBC: 3.53 MIL/uL — ABNORMAL LOW (ref 3.87–5.11)
RDW: 12.6 % (ref 11.5–15.5)
WBC: 6.9 10*3/uL (ref 4.0–10.5)
nRBC: 0 % (ref 0.0–0.2)

## 2024-01-09 LAB — TYPE AND SCREEN
ABO/RH(D): O NEG
Antibody Screen: NEGATIVE

## 2024-01-09 LAB — SURGICAL PCR SCREEN
MRSA, PCR: NEGATIVE
Staphylococcus aureus: NEGATIVE

## 2024-01-09 LAB — ABO/RH: ABO/RH(D): O NEG

## 2024-01-09 SURGERY — ARTHROPLASTY, HIP, TOTAL, ANTERIOR APPROACH
Anesthesia: General | Site: Hip | Laterality: Right

## 2024-01-09 MED ORDER — DOCUSATE SODIUM 100 MG PO CAPS
100.0000 mg | ORAL_CAPSULE | Freq: Two times a day (BID) | ORAL | Status: DC
  Administered 2024-01-09 – 2024-01-11 (×4): 100 mg via ORAL
  Filled 2024-01-09 (×4): qty 1

## 2024-01-09 MED ORDER — PRONTOSAN WOUND IRRIGATION OPTIME
TOPICAL | Status: DC | PRN
  Administered 2024-01-09: 350 mL via TOPICAL

## 2024-01-09 MED ORDER — DEXAMETHASONE SODIUM PHOSPHATE 10 MG/ML IJ SOLN
INTRAMUSCULAR | Status: DC | PRN
  Administered 2024-01-09: 5 mg via INTRAVENOUS

## 2024-01-09 MED ORDER — PROPOFOL 10 MG/ML IV BOLUS
INTRAVENOUS | Status: DC | PRN
  Administered 2024-01-09: 130 mg via INTRAVENOUS

## 2024-01-09 MED ORDER — EPHEDRINE SULFATE-NACL 50-0.9 MG/10ML-% IV SOSY
PREFILLED_SYRINGE | INTRAVENOUS | Status: DC | PRN
  Administered 2024-01-09: 7.5 mg via INTRAVENOUS

## 2024-01-09 MED ORDER — FENTANYL CITRATE (PF) 250 MCG/5ML IJ SOLN
INTRAMUSCULAR | Status: AC
  Filled 2024-01-09: qty 5

## 2024-01-09 MED ORDER — ONDANSETRON HCL 4 MG PO TABS: 4.0000 mg | ORAL_TABLET | Freq: Four times a day (QID) | ORAL | Status: DC | PRN

## 2024-01-09 MED ORDER — HYDROMORPHONE HCL 1 MG/ML IJ SOLN
INTRAMUSCULAR | Status: AC
  Filled 2024-01-09: qty 0.5

## 2024-01-09 MED ORDER — VANCOMYCIN HCL 1000 MG IV SOLR
INTRAVENOUS | Status: AC
  Filled 2024-01-09: qty 20

## 2024-01-09 MED ORDER — ACETAMINOPHEN 325 MG PO TABS: 325.0000 mg | ORAL_TABLET | Freq: Four times a day (QID) | ORAL | Status: DC | PRN

## 2024-01-09 MED ORDER — ACETAMINOPHEN 500 MG PO TABS
1000.0000 mg | ORAL_TABLET | Freq: Four times a day (QID) | ORAL | Status: AC
  Administered 2024-01-09 – 2024-01-10 (×4): 1000 mg via ORAL
  Filled 2024-01-09 (×3): qty 2

## 2024-01-09 MED ORDER — ENOXAPARIN SODIUM 40 MG/0.4ML IJ SOSY
40.0000 mg | PREFILLED_SYRINGE | INTRAMUSCULAR | Status: DC
  Administered 2024-01-10 – 2024-01-11 (×2): 40 mg via SUBCUTANEOUS
  Filled 2024-01-09 (×2): qty 0.4

## 2024-01-09 MED ORDER — BUPIVACAINE-MELOXICAM ER 400-12 MG/14ML IJ SOLN
INTRAMUSCULAR | Status: AC
  Filled 2024-01-09: qty 1

## 2024-01-09 MED ORDER — HYDROMORPHONE HCL 1 MG/ML IJ SOLN: 0.2500 mg | INTRAMUSCULAR | Status: DC | PRN

## 2024-01-09 MED ORDER — ROCURONIUM BROMIDE 10 MG/ML (PF) SYRINGE
PREFILLED_SYRINGE | INTRAVENOUS | Status: DC | PRN
  Administered 2024-01-09: 50 mg via INTRAVENOUS

## 2024-01-09 MED ORDER — POLYETHYLENE GLYCOL 3350 17 G PO PACK: 17.0000 g | PACK | Freq: Every day | ORAL | Status: DC | PRN

## 2024-01-09 MED ORDER — PHENYLEPHRINE 80 MCG/ML (10ML) SYRINGE FOR IV PUSH (FOR BLOOD PRESSURE SUPPORT)
PREFILLED_SYRINGE | INTRAVENOUS | Status: DC | PRN
  Administered 2024-01-09: 160 ug via INTRAVENOUS

## 2024-01-09 MED ORDER — OXYCODONE-ACETAMINOPHEN 5-325 MG PO TABS: 1.0000 | ORAL_TABLET | Freq: Two times a day (BID) | ORAL | 0 refills | Status: DC | PRN

## 2024-01-09 MED ORDER — ACETAMINOPHEN 500 MG PO TABS: 1000.0000 mg | ORAL_TABLET | Freq: Once | ORAL | Status: DC

## 2024-01-09 MED ORDER — ENOXAPARIN SODIUM 40 MG/0.4ML IJ SOSY: 40.0000 mg | PREFILLED_SYRINGE | Freq: Every day | INTRAMUSCULAR | 0 refills | Status: DC

## 2024-01-09 MED ORDER — PHENYLEPHRINE HCL-NACL 20-0.9 MG/250ML-% IV SOLN
INTRAVENOUS | Status: DC | PRN
  Administered 2024-01-09: 120 ug via INTRAVENOUS

## 2024-01-09 MED ORDER — MENTHOL 3 MG MT LOZG: 1.0000 | LOZENGE | OROMUCOSAL | Status: DC | PRN

## 2024-01-09 MED ORDER — FENTANYL CITRATE (PF) 250 MCG/5ML IJ SOLN
INTRAMUSCULAR | Status: DC | PRN
  Administered 2024-01-09 (×4): 50 ug via INTRAVENOUS

## 2024-01-09 MED ORDER — LABETALOL HCL 200 MG PO TABS: 200.0000 mg | ORAL_TABLET | Freq: Two times a day (BID) | ORAL | Status: DC

## 2024-01-09 MED ORDER — OXYCODONE HCL 5 MG/5ML PO SOLN: 5.0000 mg | Freq: Once | ORAL | Status: DC | PRN

## 2024-01-09 MED ORDER — ORAL CARE MOUTH RINSE: 15.0000 mL | Freq: Once | OROMUCOSAL | Status: AC

## 2024-01-09 MED ORDER — CEFAZOLIN SODIUM-DEXTROSE 2-4 GM/100ML-% IV SOLN
2.0000 g | Freq: Four times a day (QID) | INTRAVENOUS | Status: AC
  Administered 2024-01-09 – 2024-01-10 (×3): 2 g via INTRAVENOUS
  Filled 2024-01-09 (×3): qty 100

## 2024-01-09 MED ORDER — ONDANSETRON HCL 4 MG/2ML IJ SOLN: 4.0000 mg | Freq: Once | INTRAMUSCULAR | Status: DC | PRN

## 2024-01-09 MED ORDER — ONDANSETRON HCL 4 MG/2ML IJ SOLN: 4.0000 mg | Freq: Four times a day (QID) | INTRAMUSCULAR | Status: DC | PRN

## 2024-01-09 MED ORDER — SUGAMMADEX SODIUM 200 MG/2ML IV SOLN
INTRAVENOUS | Status: DC | PRN
  Administered 2024-01-09: 200 mg via INTRAVENOUS

## 2024-01-09 MED ORDER — OXYCODONE HCL 5 MG PO TABS
5.0000 mg | ORAL_TABLET | ORAL | Status: DC | PRN
  Administered 2024-01-10 – 2024-01-11 (×2): 10 mg via ORAL
  Filled 2024-01-09 (×2): qty 2

## 2024-01-09 MED ORDER — OXYCODONE HCL 5 MG PO TABS
10.0000 mg | ORAL_TABLET | ORAL | Status: DC | PRN
  Administered 2024-01-10 (×2): 15 mg via ORAL
  Filled 2024-01-09 (×2): qty 3

## 2024-01-09 MED ORDER — CHLORHEXIDINE GLUCONATE 0.12 % MT SOLN
OROMUCOSAL | Status: AC
  Administered 2024-01-09: 15 mL via OROMUCOSAL
  Filled 2024-01-09: qty 15

## 2024-01-09 MED ORDER — POVIDONE-IODINE 10 % EX SWAB
2.0000 | Freq: Once | CUTANEOUS | Status: AC
Start: 2024-01-09 — End: 2024-01-09
  Administered 2024-01-09: 2 via TOPICAL

## 2024-01-09 MED ORDER — TRANEXAMIC ACID 1000 MG/10ML IV SOLN
2000.0000 mg | Freq: Once | INTRAVENOUS | Status: AC
  Administered 2024-01-09: 2000 mg via TOPICAL
  Filled 2024-01-09: qty 20

## 2024-01-09 MED ORDER — ALUM & MAG HYDROXIDE-SIMETH 200-200-20 MG/5ML PO SUSP: 30.0000 mL | ORAL | Status: DC | PRN

## 2024-01-09 MED ORDER — LIDOCAINE 2% (20 MG/ML) 5 ML SYRINGE
INTRAMUSCULAR | Status: DC | PRN
  Administered 2024-01-09: 40 mg via INTRAVENOUS

## 2024-01-09 MED ORDER — 0.9 % SODIUM CHLORIDE (POUR BTL) OPTIME
TOPICAL | Status: DC | PRN
  Administered 2024-01-09: 1000 mL

## 2024-01-09 MED ORDER — HYDROMORPHONE HCL 1 MG/ML IJ SOLN
0.5000 mg | INTRAMUSCULAR | Status: DC | PRN
  Administered 2024-01-10 (×2): 1 mg via INTRAVENOUS
  Filled 2024-01-09 (×3): qty 1

## 2024-01-09 MED ORDER — TRANEXAMIC ACID-NACL 1000-0.7 MG/100ML-% IV SOLN
1000.0000 mg | INTRAVENOUS | Status: AC
  Administered 2024-01-09: 1000 mg via INTRAVENOUS
  Filled 2024-01-09: qty 100

## 2024-01-09 MED ORDER — LACTATED RINGERS IV SOLN: INTRAVENOUS | Status: DC | PRN

## 2024-01-09 MED ORDER — SODIUM CHLORIDE 0.9 % IR SOLN
Status: DC | PRN
  Administered 2024-01-09: 1000 mL

## 2024-01-09 MED ORDER — CEFAZOLIN SODIUM-DEXTROSE 2-4 GM/100ML-% IV SOLN
2.0000 g | INTRAVENOUS | Status: AC
  Administered 2024-01-09: 2 g via INTRAVENOUS
  Filled 2024-01-09: qty 100

## 2024-01-09 MED ORDER — SORBITOL 70 % SOLN
30.0000 mL | Freq: Every day | Status: DC | PRN
Start: 2024-01-09 — End: 2024-01-11

## 2024-01-09 MED ORDER — CHLORHEXIDINE GLUCONATE 4 % EX SOLN: 60.0000 mL | Freq: Once | CUTANEOUS | Status: DC

## 2024-01-09 MED ORDER — LABETALOL HCL 200 MG PO TABS
200.0000 mg | ORAL_TABLET | Freq: Every day | ORAL | Status: DC
  Administered 2024-01-09 – 2024-01-11 (×3): 200 mg via ORAL
  Filled 2024-01-09 (×3): qty 1

## 2024-01-09 MED ORDER — TRANEXAMIC ACID-NACL 1000-0.7 MG/100ML-% IV SOLN
1000.0000 mg | Freq: Once | INTRAVENOUS | Status: AC
  Administered 2024-01-09: 1000 mg via INTRAVENOUS
  Filled 2024-01-09: qty 100

## 2024-01-09 MED ORDER — METHOCARBAMOL 500 MG PO TABS
500.0000 mg | ORAL_TABLET | Freq: Four times a day (QID) | ORAL | Status: DC | PRN
  Administered 2024-01-10: 500 mg via ORAL
  Filled 2024-01-09: qty 1

## 2024-01-09 MED ORDER — METHOCARBAMOL 1000 MG/10ML IJ SOLN: 500.0000 mg | Freq: Four times a day (QID) | INTRAMUSCULAR | Status: DC | PRN

## 2024-01-09 MED ORDER — AMISULPRIDE (ANTIEMETIC) 5 MG/2ML IV SOLN: 10.0000 mg | Freq: Once | INTRAVENOUS | Status: DC | PRN

## 2024-01-09 MED ORDER — OXYCODONE HCL 5 MG PO TABS: 5.0000 mg | ORAL_TABLET | Freq: Once | ORAL | Status: DC | PRN

## 2024-01-09 MED ORDER — HYDROMORPHONE HCL 1 MG/ML IJ SOLN
INTRAMUSCULAR | Status: DC | PRN
Start: 2024-01-09 — End: 2024-01-09
  Administered 2024-01-09: .5 mg via INTRAVENOUS

## 2024-01-09 MED ORDER — MAGNESIUM CITRATE PO SOLN: 1.0000 | Freq: Once | ORAL | Status: DC | PRN

## 2024-01-09 MED ORDER — VANCOMYCIN HCL 1 G IV SOLR
INTRAVENOUS | Status: DC | PRN
  Administered 2024-01-09: 1000 mg

## 2024-01-09 MED ORDER — CHLORHEXIDINE GLUCONATE 0.12 % MT SOLN: 15.0000 mL | Freq: Once | OROMUCOSAL | Status: AC

## 2024-01-09 MED ORDER — PHENOL 1.4 % MT LIQD: 1.0000 | OROMUCOSAL | Status: DC | PRN

## 2024-01-09 SURGICAL SUPPLY — 56 items
BAG COUNTER SPONGE SURGICOUNT (BAG) ×1 IMPLANT
BAG DECANTER FOR FLEXI CONT (MISCELLANEOUS) ×1 IMPLANT
BLADE SAG 18X100X1.27 (BLADE) ×1 IMPLANT
COVER PERINEAL POST (MISCELLANEOUS) ×1 IMPLANT
COVER SURGICAL LIGHT HANDLE (MISCELLANEOUS) ×1 IMPLANT
CUP SECTOR GRIPTON 50MM (Cup) IMPLANT
DERMABOND ADVANCED .7 DNX12 (GAUZE/BANDAGES/DRESSINGS) IMPLANT
DRAPE C-ARM 42X72 X-RAY (DRAPES) ×1 IMPLANT
DRAPE POUCH INSTRU U-SHP 10X18 (DRAPES) ×1 IMPLANT
DRAPE STERI IOBAN 125X83 (DRAPES) ×1 IMPLANT
DRAPE U-SHAPE 47X51 STRL (DRAPES) ×2 IMPLANT
DRSG AQUACEL AG ADV 3.5X10 (GAUZE/BANDAGES/DRESSINGS) ×1 IMPLANT
DURAPREP 26ML APPLICATOR (WOUND CARE) ×2 IMPLANT
ELECT BLADE 4.0 EZ CLEAN MEGAD (MISCELLANEOUS) ×1 IMPLANT
ELECT REM PT RETURN 9FT ADLT (ELECTROSURGICAL) ×1 IMPLANT
ELECTRODE BLDE 4.0 EZ CLN MEGD (MISCELLANEOUS) ×1 IMPLANT
ELECTRODE REM PT RTRN 9FT ADLT (ELECTROSURGICAL) ×1 IMPLANT
GLOVE BIOGEL PI IND STRL 7.0 (GLOVE) ×2 IMPLANT
GLOVE BIOGEL PI IND STRL 7.5 (GLOVE) ×5 IMPLANT
GLOVE ECLIPSE 7.0 STRL STRAW (GLOVE) ×2 IMPLANT
GLOVE SKINSENSE STRL SZ7.5 (GLOVE) ×1 IMPLANT
GLOVE SURG SYN 7.5 E (GLOVE) ×2 IMPLANT
GLOVE SURG SYN 7.5 PF PI (GLOVE) ×2 IMPLANT
GLOVE SURG UNDER POLY LF SZ7 (GLOVE) ×3 IMPLANT
GLOVE SURG UNDER POLY LF SZ7.5 (GLOVE) ×2 IMPLANT
GOWN STRL REUS W/ TWL LRG LVL3 (GOWN DISPOSABLE) IMPLANT
GOWN STRL REUS W/ TWL XL LVL3 (GOWN DISPOSABLE) ×1 IMPLANT
GOWN STRL SURGICAL XL XLNG (GOWN DISPOSABLE) ×1 IMPLANT
GOWN TOGA ZIPPER T7+ PEEL AWAY (MISCELLANEOUS) ×2 IMPLANT
HEAD FEM STD 32X+1 STRL (Hips) IMPLANT
HOOD PEEL AWAY T7 (MISCELLANEOUS) ×1 IMPLANT
IV NS IRRIG 3000ML ARTHROMATIC (IV SOLUTION) ×1 IMPLANT
KIT BASIN OR (CUSTOM PROCEDURE TRAY) ×1 IMPLANT
LINER ACET PNNCL PLUS4 NEUTRAL (Hips) IMPLANT
MARKER SKIN DUAL TIP RULER LAB (MISCELLANEOUS) ×1 IMPLANT
NDL SPNL 18GX3.5 QUINCKE PK (NEEDLE) ×1 IMPLANT
NEEDLE SPNL 18GX3.5 QUINCKE PK (NEEDLE) ×1 IMPLANT
PACK TOTAL JOINT (CUSTOM PROCEDURE TRAY) ×1 IMPLANT
PACK UNIVERSAL I (CUSTOM PROCEDURE TRAY) ×1 IMPLANT
PINNACLE PLUS 4 NEUTRAL (Hips) ×1 IMPLANT
SCREW 6.5MMX25MM (Screw) IMPLANT
SET HNDPC FAN SPRY TIP SCT (DISPOSABLE) ×1 IMPLANT
SOLUTION PRONTOSAN WOUND 350ML (IRRIGATION / IRRIGATOR) ×1 IMPLANT
STAPLER VISISTAT 35W (STAPLE) IMPLANT
STEM FEM ACTIS STD SZ4 (Stem) IMPLANT
SUT ETHIBOND 2 V 37 (SUTURE) ×1 IMPLANT
SUT ETHILON 2 0 FS 18 (SUTURE) IMPLANT
SUT VIC AB 0 CT1 27XBRD ANBCTR (SUTURE) ×1 IMPLANT
SUT VIC AB 1 CTX36XBRD ANBCTR (SUTURE) ×1 IMPLANT
SUT VIC AB 2-0 CT1 TAPERPNT 27 (SUTURE) ×2 IMPLANT
SYR 50ML LL SCALE MARK (SYRINGE) ×1 IMPLANT
TOWEL GREEN STERILE (TOWEL DISPOSABLE) ×1 IMPLANT
TRAY CATH INTERMITTENT SS 16FR (CATHETERS) IMPLANT
TRAY FOLEY W/BAG SLVR 16FR ST (SET/KITS/TRAYS/PACK) IMPLANT
TUBE SUCT ARGYLE STRL (TUBING) ×1 IMPLANT
YANKAUER SUCT BULB TIP NO VENT (SUCTIONS) ×1 IMPLANT

## 2024-01-09 NOTE — Anesthesia Procedure Notes (Signed)
 Procedure Name: Intubation Date/Time: 01/09/2024 1:23 PM  Performed by: Delores Duwaine SAUNDERS, CRNAPre-anesthesia Checklist: Patient identified, Emergency Drugs available, Suction available and Patient being monitored Patient Re-evaluated:Patient Re-evaluated prior to induction Oxygen Delivery Method: Circle System Utilized Preoxygenation: Pre-oxygenation with 100% oxygen Induction Type: IV induction Ventilation: Mask ventilation without difficulty Laryngoscope Size: Mac and 3 Grade View: Grade I Tube type: Oral Tube size: 7.0 mm Number of attempts: 1 Airway Equipment and Method: Stylet and Oral airway Placement Confirmation: ETT inserted through vocal cords under direct vision, positive ETCO2 and breath sounds checked- equal and bilateral Secured at: 21 cm Tube secured with: Tape Dental Injury: Teeth and Oropharynx as per pre-operative assessment

## 2024-01-09 NOTE — Progress Notes (Signed)
 PT Cancellation Note  Patient Details Name: [name] MRN: [id number] DOB: July 16, 1951   Cancelled Treatment:    Reason Eval/Treat Not Completed: Patient not medically ready (plan for hip arthroplasty today).  Aleck Daring, PT, DPT Acute Rehabilitation Services Office [phone]    [name] Daring 01/09/2024, 7:56 AM

## 2024-01-09 NOTE — Transfer of Care (Signed)
 Immediate Anesthesia Transfer of Care Note  Patient: [name]  Procedure(s) Performed: RIGHT TOTAL HIP ARTHROPLASTY (Right: Hip)  Patient Location: PACU  Anesthesia Type:General  Level of Consciousness: awake and drowsy  Airway & Oxygen Therapy: Patient Spontanous Breathing and Patient connected to face mask oxygen  Post-op Assessment: Report given to RN and Post -op Vital signs reviewed and stable  Post vital signs: Reviewed and stable  Last Vitals:  Vitals Value Taken Time  BP 167/70 01/09/24 1500  Temp 36.1 C 01/09/24 1500  Pulse 71 01/09/24 1503  Resp 14 01/09/24 1503  SpO2 100 % 01/09/24 1503  Vitals shown include unfiled device data.  Last Pain:  Vitals:   01/09/24 1214  TempSrc:   PainSc: 8       Patients Stated Pain Goal: 2 (01/09/24 1214)  Complications: No notable events documented.

## 2024-01-09 NOTE — Progress Notes (Signed)
 OT Cancellation Note  Patient Details Name: Alira Fretwell MRN: [id number] DOB: 04-20-1951   Cancelled Treatment:    Reason Eval/Treat Not Completed: Medical issues which prohibited therapy Plan for hip arthroplasty today. Will follow up for OT eval post op.  Keaghan Bowens  Britt 01/09/2024, 8:33 AM

## 2024-01-09 NOTE — Addendum Note (Signed)
 Addendum  created 01/09/24 1748 by Little Ishikawa, CRNA   Intraprocedure Staff edited

## 2024-01-09 NOTE — Anesthesia Preprocedure Evaluation (Addendum)
 Anesthesia Evaluation  Patient identified by MRN, date of birth, ID band Patient awake    Reviewed: Allergy & Precautions, NPO status , Patient's Chart, lab work & pertinent test results, reviewed documented beta blocker date and time   Airway Mallampati: II  TM Distance: >3 FB Neck ROM: Full    Dental  (+) Teeth Intact, Dental Advisory Given   Pulmonary Current Smoker and Patient abstained from smoking.   Pulmonary exam normal breath sounds clear to auscultation       Cardiovascular hypertension (148/77 preop), Pt. on medications and Pt. on home beta blockers Normal cardiovascular exam Rhythm:Regular Rate:Normal     Neuro/Psych  PSYCHIATRIC DISORDERS Anxiety Depression    negative neurological ROS     GI/Hepatic negative GI ROS, Neg liver ROS,,,  Endo/Other  negative endocrine ROS    Renal/GU negative Renal ROS     Musculoskeletal R hip fx   Abdominal   Peds  Hematology  (+) Blood dyscrasia, anemia Hb 11.9, plt 178   Anesthesia Other Findings   Reproductive/Obstetrics negative OB ROS                             Anesthesia Physical Anesthesia Plan  ASA: 2  Anesthesia Plan: General   Post-op Pain Management: Tylenol  PO (pre-op)*   Induction:   PONV Risk Score and Plan: 2 and Ondansetron , Dexamethasone  and Treatment may vary due to age or medical condition  Airway Management Planned: Oral ETT  Additional Equipment: None  Intra-op Plan:   Post-operative Plan: Extubation in OR  Informed Consent: I have reviewed the patients History and Physical, chart, labs and discussed the procedure including the risks, benefits and alternatives for the proposed anesthesia with the patient or authorized representative who has indicated his/her understanding and acceptance.     Dental advisory given  Plan Discussed with: CRNA  Anesthesia Plan Comments: (Lovenox  40mg  given <24h ago, pt only  52 kg)       Anesthesia Quick Evaluation

## 2024-01-09 NOTE — H&P (Signed)
 PREOPERATIVE H&P  Chief Complaint: RIGHT HIP FRACTURE  HPI: [name] is a 73 y.o. female who presents for surgical treatment of RIGHT HIP FRACTURE.  She denies any changes in medical history.  Past Surgical History:  Procedure Laterality Date   BREAST EXCISIONAL BIOPSY Left 1981   CARPAL TUNNEL RELEASE  2007   Social History   Socioeconomic History   Marital status: Divorced    Spouse name: Not on file   Number of children: Not on file   Years of education: Not on file   Highest education level: Not on file  Occupational History   Not on file  Tobacco Use   Smoking status: Every Day    Types: Cigarettes   Smokeless tobacco: Never  Vaping Use   Vaping status: Never Used  Substance and Sexual Activity   Alcohol use: Yes    Alcohol/week: 3.0 standard drinks of alcohol    Types: 3 Cans of beer per week    Comment: daily   Drug use: Never   Sexual activity: Not on file  Other Topics Concern   Not on file  Social History Narrative   Not on file   Social Drivers of Health   Financial Resource Strain: Not on file  Food Insecurity: No Food Insecurity (01/07/2024)   Hunger Vital Sign    Worried About Running Out of Food in the Last Year: Never true    Ran Out of Food in the Last Year: Never true  Transportation Needs: No Transportation Needs (01/07/2024)   PRAPARE - Administrator, Civil Service (Medical): No    Lack of Transportation (Non-Medical): No  Physical Activity: Not on file  Stress: Not on file  Social Connections: Moderately Integrated (01/07/2024)   Social Connection and Isolation Panel [NHANES]    Frequency of Communication with Friends and Family: More than three times a week    Frequency of Social Gatherings with Friends and Family: Once a week    Attends Religious Services: More than 4 times per year    Active Member of [employer] or Organizations: Yes    Attends Engineer, Structural: More than 4 times per year    Marital  Status: Divorced   Family History  Problem Relation Age of Onset   Breast cancer Maternal Aunt    Hypertension Mother    Cancer Mother        cervical   Diabetes Mother    Hypertension Father    Diabetes Father    Diabetes Sister    Hypertension Sister    Diabetes Brother    Hypertension Brother    No Known Allergies Prior to Admission medications   Medication Sig Start Date End Date Taking? Authorizing Provider  labetalol  (NORMODYNE ) 200 MG tablet Take 200 mg by mouth once.   Yes [provider]  minoxidil  (LONITEN ) 2.5 MG tablet Take 2.5 mg by mouth daily. 11/17/23  Yes [provider]  spironolactone  (ALDACTONE ) 25 MG tablet Take 25 mg by mouth daily.   Yes [provider]  buPROPion (WELLBUTRIN XL) 150 MG 24 hr tablet Take 150 mg by mouth daily. Patient not taking: Reported on 01/07/2024 12/29/18   [provider]  buPROPion (WELLBUTRIN XL) 300 MG 24 hr tablet Take 300 mg by mouth daily. Patient not taking: Reported on 01/07/2024 06/13/20   [provider]     Positive ROS: All other systems have been reviewed and were otherwise negative with the exception  of those mentioned in the HPI and as above.  Physical Exam: General: Alert, no acute distress Cardiovascular: No pedal edema Respiratory: No cyanosis, no use of accessory musculature GI: abdomen soft Skin: No lesions in the area of chief complaint Neurologic: Sensation intact distally Psychiatric: Patient is competent for consent with normal mood and affect Lymphatic: no lymphedema  MUSCULOSKELETAL: exam stable  Assessment: RIGHT HIP FRACTURE  Plan: Plan for Procedure(s): TOTAL HIP ARTHROPLASTY ANTERIOR APPROACH  The risks benefits and alternatives were discussed with the patient including but not limited to the risks of nonoperative treatment, versus surgical intervention including infection, bleeding, nerve injury,  blood clots, cardiopulmonary complications, morbidity,  mortality, among others, and they were willing to proceed.   Ozell Cummins, MD 01/09/2024 12:09 PM

## 2024-01-09 NOTE — Discharge Instructions (Signed)
    1. Change dressings as needed 2. May shower but keep incisions covered and dry 3. Take your prescribed blood thinner to prevent blood clots 4. Take stool softeners as needed 5. Take pain meds as needed  I have reviewed the patient's history and given the presence of a fragility fracture, I have deemed the necessity of a osteoporosis management referral or confirmed that the patient is currently enrolled in a osteoporosis treatment program.

## 2024-01-09 NOTE — Op Note (Signed)
 RIGHT TOTAL HIP ARTHROPLASTY  Procedure Note Harmonie Verrastro   [id number]  Pre-op Diagnosis: Right femoral neck fracture     Post-op Diagnosis: same  Operative Findings Acute femoral neck fracture   Operative Procedures  1. Total hip replacement; Right hip; uncemented [id number]   Surgeon: Kay Cummins, M.D.  Assist: Ronal Morna Grave, PA-C   Anesthesia: general  Prosthesis: Depuy Acetabulum: Pinnacle 50 mm Femur: Actis 4 STD Head: 32 mm size: +1 Liner: +4 Bearing Type: metal/poly  Total Hip Arthroplasty (Anterior Approach) Op Note:  After informed consent was obtained and the operative extremity marked in the holding area, the patient was brought back to the operating room and placed supine on the HANA table. Next, the operative extremity was prepped and draped in normal sterile fashion. Surgical timeout occurred verifying patient identification, surgical site, surgical procedure and administration of antibiotics.  A 10 cm longitudinal incision was made starting from 2 fingerbreadths lateral and inferior to the ASIS towards the lateral aspect of the patella.  A Hueter approach to the hip was performed, using the interval between tensor fascia lata and sartorius.  Dissection was carried bluntly down onto the anterior hip capsule. The lateral femoral circumflex vessels were identified and coagulated. A capsulotomy was performed and the capsular flaps tagged for later repair.  Fracture hematoma was evacuated.  The neck osteotomy was performed. The femoral head was removed, the acetabular rim was cleared of soft tissue and osteophytes and attention was turned to reaming the acetabulum.  Sequential reaming was performed under fluoroscopic guidance down to the floor of the cotyloid fossa. We reamed to a size 49 mm, and then impacted the acetabular shell. A 25 mm cancellous screw was placed to secure the shell.  The liner was then placed after irrigation and attention turned to the  femur.  After placing the femoral hook, the leg was taken to externally rotated, extended and adducted position taking care to perform soft tissue releases to allow for adequate mobilization of the femur. Soft tissue was cleared from the shoulder of the greater trochanter and the hook elevator used to improve exposure of the proximal femur. Sequential broaching performed up to a size 4. Trial neck and head were placed. The leg was brought back up to neutral and the construct reduced.  The position and sizing of components, offset and leg lengths were checked using fluoroscopy. Stability of the construct was checked in 45 degrees of hip extension and 90 degrees of external rotation without any subluxation, shuck or impingement of prosthesis. We dislocated the prosthesis, dropped the leg back into position, removed trial components, and irrigated copiously. The final stem and head was then placed, the leg brought back up, the system reduced and fluoroscopy used to verify positioning.  Antibiotic irrigation was placed in the surgical wound.   We irrigated, obtained hemostasis and closed the capsule using #2 ethibond suture.  A topical mixture of 0.25% bupivacaine  and meloxicam  was placed deep to the fascia.  One gram of vancomycin  powder was placed in the surgical bed.   One gram of topical tranexamic acid  was injected into the joint.  The fascia was closed with #1 vicryl plus, the deep fat layer was closed with 0 vicryl, the subcutaneous layers closed with 2.0 Vicryl Plus and the skin closed with 2.0 nylon and dermabond. A sterile dressing was applied. The patient was awakened in the operating room and taken to recovery in stable condition.  All sponge, needle, and instrument counts were  correct at the end of the case.   Morna Grave, my PA, was a medical necessity for opening, closing, limb positioning, retracting, exposing, and overall facilitation and timely completion of the surgery.  Position: supine   Complications: see description of procedure.  Time Out: performed   Drains/Packing: none  Estimated blood loss: see anesthesia record  Returned to Recovery Room: in good condition.   Antibiotics: yes   Mechanical VTE (DVT) Prophylaxis: sequential compression devices, TED thigh-high  Chemical VTE (DVT) Prophylaxis: lovenox  POD 1   Fluid Replacement: see anesthesia record  Specimens Removed: 1 to pathology   Sponge and Instrument Count Correct? yes   PACU: portable radiograph - low AP   Plan/RTC: Return in 2 weeks for staple removal. Weight Bearing/Load Lower Extremity: full  Hip precautions: none Suture Removal: 2 weeks   N. Ozell Cummins, MD Maralee Morita 2:23 PM   Implant Name Type Inv. Item Serial No. Manufacturer Lot No. LRB No. Used Action  CUP SECTOR GRIPTON - [id number] Cup CUP SECTOR GRIPTON  [employer] [id number] Right 1 Implanted  SCREW 6.5MMX25MM - [id number] Screw SCREW 6.5MMX25MM  [employer]PUY ORTH[employer] [id number] Right 1 Implanted  PINNACLE PLUS 4 NEUTRAL - [id number] Hips PINNACLE PLUS 4 NEUTRAL  DEPU[employer]TH[employer] [id number] Right 1 Implanted  STEM FEM ACTIS STD SZ4 - [id number] Stem STEM FEM ACTIS STD SZ4  [employer] [id number] Right 1 Implanted  HEAD FEM STD 32X+1 STRL - [id number] Hips HEAD FEM STD 32X+1 STRL  [employer] [id number] Right 1 Implanted

## 2024-01-09 NOTE — Progress Notes (Signed)
 PROGRESS NOTE  [name]  DOB: 03-Dec-1951  PCP: Benjamine Aland, MD FMW:[id number]  DOA: 01/07/2024  LOS: 2 days  Hospital Day: 3  Brief narrative: [name] is a 73 y.o. female with PMH significant for HTN, HLD, anxiety/depression. Patient lives alone with her dog and at baseline relatively healthy and independent. 1/11, patient presented to the ED for evaluation of right hip pain after a recent fall.  On 1/8, she accidentally stepped on her puppies toy, slipped and fell on her right side. She had mild discomfort but was ultimately able to get up.  Pain gradually progressed and on 1/10 when she tried to get up from her couch, her right leg gave out she ended up on the floor.  She was unable to get up until the next day when her brother came to visit her and was ultimately brought to ED.  In the ED, patient was afebrile, initial blood pressure over 200/97, breathing on room air Labs with CK more than 2400, renal function normal Chest x-ray with no active disease X-ray of the pelvis and right femur shows acute fracture of the proximal right femoral head-neck junction with up to 1.6 cm diastasis at the superior aspect of the fracture Patient received IV morphine  4 mg and IV Zofran  4 mg Orthopedic surgery was consulted for evaluation Admitted to [facility]  Subjective: Patient was seen and examined this morning.  pleasant elderly female.  Not in distress. Was waiting for surgery.  Assessment and plan: Right femoral neck fracture Secondary to mechanical fall 3 days prior Imaging as above  Orthopedic consulted  Pending right total hip arthroplasty today For pain control, will start on scheduled Tylenol , as needed oxycodone  and IV Dilaudid  as needed Bowel regimen with scheduled Senokot and as needed MiraLAX  DVT prophylaxis with Lovenox  subcu Check vitamin D  level adequate. Fall precautions  Low-grade fever 2 episodes of temperature 100.5 previous night.  No clear evidence  of infection.  Not on antibiotics. No fever in last 24 hours. Recent Labs  Lab 01/07/24 1256 01/08/24 0445 01/09/24 0449  WBC 11.1* 8.9 6.9   Rhabdomyolysis Due to being on the floor for several hours. Continue monitor on IV fluids.  Currently on NS at 75 mL/h.  Repeat CK level tomorrow. Recent Labs  Lab 01/07/24 1256 01/08/24 0445  CKTOTAL 2,436* 1,231*    Essential HTN BP was initially elevated with SBP in the 200s on arrival, likely due to pain Improving gradually. PTA meds- labetalol  200 mg daily and spironolactone  25 mg daily  Continue labetalol .  Keep Aldactone  on hold while getting IV fluid IV hydralazine as needed.   Hx of hair loss Continue minoxidil  2.5 mg daily   Mobility: Needs PT eval postprocedure  Goals of care   Code Status: Full Code     DVT prophylaxis:  Plan to resume Lovenox  postop.   Antimicrobials: None Fluid: NS at 75 mL/h to continue Consultants: Orthopedics Family Communication: None at bedside  Status: Inpatient Level of care:  Med-Surg   Patient is from: Home Needs to continue in-hospital care: Pending surgery today Anticipated d/c to: Pending surgery      Diet:  Diet Order             Diet NPO time specified Except for: Sips with Meds  Diet effective now                   Scheduled Meds:  [MAR Hold] acetaminophen   1,000 mg Oral TID  acetaminophen   1,000 mg Oral Once   chlorhexidine   60 mL Topical Once   labetalol   200 mg Oral Daily   [MAR Hold] minoxidil   2.5 mg Oral Daily   [MAR Hold] senna-docusate  1 tablet Oral QHS    PRN meds: 0.9 % irrigation (POUR BTL), [MAR Hold]  HYDROmorphone  (DILAUDID ) injection, [MAR Hold] ondansetron  **OR** [MAR Hold] ondansetron  (ZOFRAN ) IV, [MAR Hold] oxyCODONE , [MAR Hold] polyethylene glycol, Prontosan Wound Irrigation - Optime, sodium chloride  irrigation, vancomycin    Infusions:   sodium chloride  75 mL/hr at 01/08/24 1007    Antimicrobials: Anti-infectives (From  admission, onward)    Start     Dose/Rate Route Frequency Ordered Stop   01/09/24 1356  vancomycin  (VANCOCIN ) powder          As needed 01/09/24 1357     01/09/24 1100  ceFAZolin  (ANCEF ) IVPB 2g/100 mL premix        2 g 200 mL/hr over 30 Minutes Intravenous On call to O.R. 01/09/24 1011 01/09/24 1332       Objective: Vitals:   01/09/24 0854 01/09/24 1155  BP: (!) 148/77 (!) 155/72  Pulse: 75 75  Resp: 16 18  Temp: 98.1 F (36.7 C) 98.3 F (36.8 C)  SpO2: 100% 98%    Intake/Output Summary (Last 24 hours) at 01/09/2024 1359 Last data filed at 01/09/2024 1134 Gross per 24 hour  Intake --  Output 1350 ml  Net -1350 ml   Filed Weights   01/07/24 1240  Weight: 52.6 kg   Weight change:  Body mass index is 21.22 kg/m.   Physical Exam: General exam: Pleasant, not in distress.  Pain controlled at rest Skin: No rashes, lesions or ulcers. HEENT: Atraumatic, normocephalic, no obvious bleeding Lungs: Clear to auscultation bilaterally,  CVS: S1, S2, no murmur,   GI/Abd: Soft, nontender, nondistended, bowel sound present,   CNS: Alert, awake, oriented x 3 Psychiatry: Mood appropriate,  Extremities: No pedal edema, no calf tenderness,   Data Review: I have personally reviewed the laboratory data and studies available.  F/u labs  Unresulted Labs (From admission, onward)     Start     Ordered   01/10/24 0500  CBC with Differential/Platelet  Tomorrow morning,   R       Question:  Specimen collection method  Answer:  Lab=Lab collect   01/09/24 1356   01/10/24 0500  Basic metabolic panel  Tomorrow morning,   R       Question:  Specimen collection method  Answer:  Lab=Lab collect   01/09/24 1356   01/10/24 0500  CK  Tomorrow morning,   R       Question:  Specimen collection method  Answer:  Lab=Lab collect   01/09/24 1356   Signed and Held  CBC  (enoxaparin  (LOVENOX )  CrCl >/= 30 mL/min  )  Once,   R       Comments: Baseline for enoxaparin  therapy IF NOT ALREADY DRAWN. Notify  MD if PLT < 100 K.   Question:  Specimen collection method  Answer:  Lab=Lab collect   Signed and Held   Signed and Held  Creatinine, serum  (enoxaparin  (LOVENOX )  CrCl >/= 30 mL/min  )  Once,   R       Comments: Baseline for enoxaparin  therapy IF NOT ALREADY DRAWN.   Question:  Specimen collection method  Answer:  Lab=Lab collect   Signed and Held   Signed and Held  Creatinine, serum  (enoxaparin  (LOVENOX )  CrCl >/=  30 mL/min  )  Weekly,   R     Comments: while on enoxaparin  therapy.   Question:  Specimen collection method  Answer:  Lab=Lab collect   Signed and Held   Signed and Held  CBC  Daily,   R     Comments: For 3 days.   Question:  Specimen collection method  Answer:  Lab=Lab collect   Signed and Held   Signed and Held  Basic metabolic panel  Daily,   R     Comments: For 2 days .   Question:  Specimen collection method  Answer:  Lab=Lab collect   Signed and Held            Total time spent in review of labs and imaging, patient evaluation, formulation of plan, documentation and communication with family: 45 minutes  Signed, Chapman Rota, MD [facility] 01/09/2024

## 2024-01-09 NOTE — Anesthesia Postprocedure Evaluation (Signed)
 Anesthesia Post Note  Patient: [name]  Procedure(s) Performed: RIGHT TOTAL HIP ARTHROPLASTY (Right: Hip)     Patient location during evaluation: PACU Anesthesia Type: General Level of consciousness: awake and alert, oriented and patient cooperative Pain management: pain level controlled Vital Signs Assessment: post-procedure vital signs reviewed and stable Respiratory status: spontaneous breathing, nonlabored ventilation and respiratory function stable Cardiovascular status: blood pressure returned to baseline and stable Postop Assessment: no apparent nausea or vomiting Anesthetic complications: no   No notable events documented.  Last Vitals:  Vitals:   01/09/24 1500 01/09/24 1515  BP: (!) 167/70 (!) 178/78  Pulse: 75 96  Resp: 14 15  Temp: (!) 36.1 C   SpO2: 100% 94%    Last Pain:  Vitals:   01/09/24 1500  TempSrc:   PainSc: Asleep                 [name]

## 2024-01-09 NOTE — Consult Note (Signed)
 Reason for Consult:Right hip fx Referring Physician: Arvella Mon Time called: 0730 Time at bedside: 0858   [name] is an 73 y.o. female.  HPI: [name] slipped on a dog toy and fell at home last Wednesday. She was able to get up and hobble around for a couple of days but one day had severe enough pain that she sat down on the floor and had to call EMS. She was brought to the ED where x-rays showed a fem neck fx and orthopedic surgery was consulted. Due to the nature of the fracture and joint replacement specialist was consulted. She lives at home alone and does not use any assistive devices to ambulate.  Past Medical History:  Diagnosis Date   Anxiety    Breast cyst 1982   Depression    Eczema    Hypertension    Lipid disorder    Mood swings     Past Surgical History:  Procedure Laterality Date   BREAST EXCISIONAL BIOPSY Left 1981   CARPAL TUNNEL RELEASE  2007    Family History  Problem Relation Age of Onset   Breast cancer Maternal Aunt    Hypertension Mother    Cancer Mother        cervical   Diabetes Mother    Hypertension Father    Diabetes Father    Diabetes Sister    Hypertension Sister    Diabetes Brother    Hypertension Brother     Social History:  reports that she has an unknown smoking status. She has never used smokeless tobacco. No history on file for alcohol use and drug use.  Allergies: No Known Allergies  Medications: I have reviewed the patient's current medications.  Results for orders placed or performed during the hospital encounter of 01/07/24 (from the past 48 hours)  CBC with Differential     Status: Abnormal   Collection Time: 01/07/24 12:56 PM  Result Value Ref Range   WBC 11.1 (H) 4.0 - 10.5 K/uL   RBC 4.46 3.87 - 5.11 MIL/uL   Hemoglobin 15.0 12.0 - 15.0 g/dL   HCT 55.8 63.9 - 53.9 %   MCV 98.9 80.0 - 100.0 fL   MCH 33.6 26.0 - 34.0 pg   MCHC 34.0 30.0 - 36.0 g/dL   RDW 87.2 88.4 - 84.4 %   Platelets 216 150 - 400 K/uL    nRBC 0.0 0.0 - 0.2 %   Neutrophils Relative % 78 %   Neutro Abs 8.6 (H) 1.7 - 7.7 K/uL   Lymphocytes Relative 13 %   Lymphs Abs 1.5 0.7 - 4.0 K/uL   Monocytes Relative 8 %   Monocytes Absolute 0.9 0.1 - 1.0 K/uL   Eosinophils Relative 0 %   Eosinophils Absolute 0.0 0.0 - 0.5 K/uL   Basophils Relative 0 %   Basophils Absolute 0.0 0.0 - 0.1 K/uL   Immature Granulocytes 1 %   Abs Immature Granulocytes 0.06 0.00 - 0.07 K/uL    Comment: Performed at [facility], [address] [place], [place] [address]  Comprehensive metabolic panel     Status: Abnormal   Collection Time: 01/07/24 12:56 PM  Result Value Ref Range   Sodium 140 135 - 145 mmol/L   Potassium 4.1 3.5 - 5.1 mmol/L   Chloride 104 98 - 111 mmol/L   CO2 18 (L) 22 - 32 mmol/L   Glucose, Bld 129 (H) 70 - 99 mg/dL    Comment: Glucose reference range applies only  to samples taken after fasting for at least 8 hours.   BUN 19 8 - 23 mg/dL   Creatinine, Ser 9.13 0.44 - 1.00 mg/dL   Calcium 9.7 8.9 - 89.6 mg/dL   Total Protein 7.3 6.5 - 8.1 g/dL   Albumin 4.0 3.5 - 5.0 g/dL   AST 92 (H) 15 - 41 U/L   ALT 58 (H) 0 - 44 U/L   Alkaline Phosphatase 55 38 - 126 U/L   Total Bilirubin 1.7 (H) 0.0 - 1.2 mg/dL   GFR, Estimated >39 >39 mL/min    Comment: (NOTE) Calculated using the CKD-EPI Creatinine Equation (2021)    Anion gap 18 (H) 5 - 15    Comment: Performed at [facility], [address], [place], [place] [address]  CK     Status: Abnormal   Collection Time: 01/07/24 12:56 PM  Result Value Ref Range   Total CK 2,436 (H) 38 - 234 U/L    Comment: Performed at [facility], [address], [place], [place] [address]  CBC     Status: Abnormal   Collection Time: 01/08/24  4:45 AM  Result Value Ref Range   WBC 8.9 4.0 - 10.5 K/uL   RBC 3.64 (L) 3.87 - 5.11 MIL/uL   Hemoglobin 12.2 12.0 - 15.0 g/dL   HCT 64.0 (L) 63.9 - 53.9 %   MCV 98.6 80.0 - 100.0 fL   MCH 33.5 26.0 - 34.0 pg   MCHC 34.0 30.0 - 36.0 g/dL    RDW 87.1 88.4 - 84.4 %   Platelets 181 150 - 400 K/uL   nRBC 0.0 0.0 - 0.2 %    Comment: Performed at [facility], [address], [place], [place] [address]  Comprehensive metabolic panel     Status: Abnormal   Collection Time: 01/08/24  4:45 AM  Result Value Ref Range   Sodium 138 135 - 145 mmol/L   Potassium 3.7 3.5 - 5.1 mmol/L   Chloride 107 98 - 111 mmol/L   CO2 21 (L) 22 - 32 mmol/L   Glucose, Bld 111 (H) 70 - 99 mg/dL    Comment: Glucose reference range applies only to samples taken after fasting for at least 8 hours.   BUN 19 8 - 23 mg/dL   Creatinine, Ser 9.30 0.44 - 1.00 mg/dL   Calcium 8.5 (L) 8.9 - 10.3 mg/dL   Total Protein 5.4 (L) 6.5 - 8.1 g/dL   Albumin 2.9 (L) 3.5 - 5.0 g/dL   AST 57 (H) 15 - 41 U/L   ALT 40 0 - 44 U/L   Alkaline Phosphatase 40 38 - 126 U/L   Total Bilirubin 1.0 0.0 - 1.2 mg/dL   GFR, Estimated >39 >39 mL/min    Comment: (NOTE) Calculated using the CKD-EPI Creatinine Equation (2021)    Anion gap 10 5 - 15    Comment: Performed at [facility], [address] [place], [place] [address]  CK     Status: Abnormal   Collection Time: 01/08/24  4:45 AM  Result Value Ref Range   Total CK 1,231 (H) 38 - 234 U/L    Comment: Performed at [facility], [address], [place], [place] [address]  VITAMIN D  25 Hydroxy (Vit-D Deficiency, Fractures)     Status: None   Collection Time: 01/08/24  4:45 AM  Result Value Ref Range   Vit D, 25-Hydroxy 30.91 30 - 100 ng/mL  Comment: (NOTE) Vitamin D  deficiency has been defined by the Institute of Medicine  and an Endocrine Society practice guideline as a level of serum 25-OH  vitamin D  less than 20 ng/mL (1,2). The Endocrine Society went on to  further define vitamin D  insufficiency as a level between 21 and 29  ng/mL (2).  1. IOM (Institute of Medicine). 2010. Dietary reference intakes for  calcium and D. [place]: [employer]. 2. Holick MF, Binkley Emory,  Bischoff-Ferrari HA, et al. Evaluation,  treatment, and prevention of vitamin D  deficiency: an Endocrine  Society clinical practice guideline, JCEM. 2011 Jul; 96(7): 1911-30.  Performed at [facility], [address] [place], [place] [address]   Surgical pcr screen     Status: None   Collection Time: 01/09/24  4:02 AM   Specimen: Nasal Mucosa; Nasal Swab  Result Value Ref Range   MRSA, PCR NEGATIVE NEGATIVE   Staphylococcus aureus NEGATIVE NEGATIVE    Comment: (NOTE) The Xpert SA Assay (FDA approved for NASAL specimens in patients 64 years of age and older), is one component of a comprehensive surveillance program. It is not intended to diagnose infection nor to guide or monitor treatment. Performed at [facility], [address], [place], [place] [address]   Basic metabolic panel     Status: Abnormal   Collection Time: 01/09/24  4:49 AM  Result Value Ref Range   Sodium 135 135 - 145 mmol/L   Potassium 3.6 3.5 - 5.1 mmol/L   Chloride 104 98 - 111 mmol/L   CO2 23 22 - 32 mmol/L   Glucose, Bld 103 (H) 70 - 99 mg/dL    Comment: Glucose reference range applies only to samples taken after fasting for at least 8 hours.   BUN 7 (L) 8 - 23 mg/dL   Creatinine, Ser 9.34 0.44 - 1.00 mg/dL   Calcium 8.3 (L) 8.9 - 10.3 mg/dL   GFR, Estimated >39 >39 mL/min    Comment: (NOTE) Calculated using the CKD-EPI Creatinine Equation (2021)    Anion gap 8 5 - 15    Comment: Performed at [facility], [address] [place], [place] [address]  CBC with Differential/Platelet     Status: Abnormal   Collection Time: 01/09/24  4:49 AM  Result Value Ref Range   WBC 6.9 4.0 - 10.5 K/uL   RBC 3.53 (L) 3.87 - 5.11 MIL/uL   Hemoglobin 11.9 (L) 12.0 - 15.0 g/dL   HCT 64.8 (L) 63.9 - 53.9 %   MCV 99.4 80.0 - 100.0 fL   MCH 33.7 26.0 - 34.0 pg   MCHC 33.9 30.0 - 36.0 g/dL   RDW 87.3 88.4 - 84.4 %   Platelets 178 150 - 400 K/uL   nRBC 0.0 0.0 - 0.2 %   Neutrophils Relative % 55 %    Neutro Abs 3.8 1.7 - 7.7 K/uL   Lymphocytes Relative 32 %   Lymphs Abs 2.2 0.7 - 4.0 K/uL   Monocytes Relative 10 %   Monocytes Absolute 0.7 0.1 - 1.0 K/uL   Eosinophils Relative 2 %   Eosinophils Absolute 0.2 0.0 - 0.5 K/uL   Basophils Relative 1 %   Basophils Absolute 0.0 0.0 - 0.1 K/uL   Immature Granulocytes 0 %   Abs Immature Granulocytes 0.02 0.00 - 0.07 K/uL    Comment: Performed at [facility], [address], [place], [place] [address]    DG FEMUR, MIN  2 VIEWS RIGHT Result Date: 01/07/2024 CLINICAL DATA:  Fall today.  Right hip pain. EXAM: RIGHT FEMUR 2 VIEWS; PELVIS - 1-2 VIEW COMPARISON:  None Available. FINDINGS: There is diffuse decreased bone mineralization. Mild bilateral sacroiliac subchondral sclerosis. Mild right-greater-than-left femoroacetabular joint space narrowing. There is an acute fracture of the proximal right femoral head-neck junction with up to approximately 1.6 cm diastasis at the superior aspect of the fracture. There is approximately 1.5 cm proximal retraction of the distal fracture component with respect to the proximal fracture component. On lateral view there is moderate anterior apex angulation of the proximal aspect of the femoral neck at the distal fracture component. The right femoral head appears normally located within the right acetabulum. No knee joint effusion. IMPRESSION: Acute fracture of the proximal right femoral head-neck junction with up to 1.6 cm diastasis at the superior aspect of the fracture. There is approximately 1.5 cm proximal retraction of the distal fracture component with respect to the proximal fracture component. Electronically Signed   By: Tanda Lyons M.D.   On: 01/07/2024 15:13   DG Pelvis 1-2 Views Result Date: 01/07/2024 CLINICAL DATA:  Fall today.  Right hip pain. EXAM: RIGHT FEMUR 2 VIEWS; PELVIS - 1-2 VIEW COMPARISON:  None Available. FINDINGS: There is diffuse decreased bone mineralization. Mild bilateral sacroiliac  subchondral sclerosis. Mild right-greater-than-left femoroacetabular joint space narrowing. There is an acute fracture of the proximal right femoral head-neck junction with up to approximately 1.6 cm diastasis at the superior aspect of the fracture. There is approximately 1.5 cm proximal retraction of the distal fracture component with respect to the proximal fracture component. On lateral view there is moderate anterior apex angulation of the proximal aspect of the femoral neck at the distal fracture component. The right femoral head appears normally located within the right acetabulum. No knee joint effusion. IMPRESSION: Acute fracture of the proximal right femoral head-neck junction with up to 1.6 cm diastasis at the superior aspect of the fracture. There is approximately 1.5 cm proximal retraction of the distal fracture component with respect to the proximal fracture component. Electronically Signed   By: Tanda Lyons M.D.   On: 01/07/2024 15:13   DG Chest 1 View Result Date: 01/07/2024 CLINICAL DATA:  Fall fall today right hip pain. EXAM: CHEST  1 VIEW COMPARISON:  None available FINDINGS: Cardiac silhouette and mediastinal contours are within normal limits. There is mild calcification within the aortic arch. The lungs are clear. No pleural effusion or pneumothorax. Mild multilevel degenerative disc changes of the thoracic spine. IMPRESSION: No acute cardiopulmonary disease process. Electronically Signed   By: Tanda Lyons M.D.   On: 01/07/2024 15:10    Review of Systems  HENT:  Negative for ear discharge, ear pain, hearing loss and tinnitus.   Eyes:  Negative for photophobia and pain.  Respiratory:  Negative for cough and shortness of breath.   Cardiovascular:  Negative for chest pain.  Gastrointestinal:  Negative for abdominal pain, nausea and vomiting.  Genitourinary:  Negative for dysuria, flank pain, frequency and urgency.  Musculoskeletal:  Positive for arthralgias (Right hip). Negative for  back pain, myalgias and neck pain.  Neurological:  Negative for dizziness and headaches.  Hematological:  Does not bruise/bleed easily.  Psychiatric/Behavioral:  The patient is not nervous/anxious.    Blood pressure (!) 148/77, pulse 75, temperature 98.1 F (36.7 C), temperature source Oral, resp. rate 16, height 5' 2 (1.575 m), weight 52.6 kg, SpO2 100%. Physical Exam Constitutional:  General: She is not in acute distress.    Appearance: She is well-developed. She is not diaphoretic.  HENT:     Head: Normocephalic and atraumatic.  Eyes:     General: No scleral icterus.       Right eye: No discharge.        Left eye: No discharge.     Conjunctiva/sclera: Conjunctivae normal.  Cardiovascular:     Rate and Rhythm: Normal rate and regular rhythm.  Pulmonary:     Effort: Pulmonary effort is normal. No respiratory distress.  Musculoskeletal:     Cervical back: Normal range of motion.     Comments: RLE No traumatic wounds, ecchymosis, or rash  Mod TTP hip  No knee or ankle effusion  Knee stable to varus/ valgus and anterior/posterior stress  Sens DPN, SPN, TN intact  Motor EHL, ext, flex, evers 5/5  DP 2+, PT 2+, No significant edema  Skin:    General: Skin is warm and dry.  Neurological:     Mental Status: She is alert.  Psychiatric:        Mood and Affect: Mood normal.        Behavior: Behavior normal.     Assessment/Plan: Right hip fx -- Plan THA today with Dr. Jerri. Please keep NPO.    Ozell DOROTHA Ned, PA-C Orthopedic Surgery [phone] 01/09/2024, 9:19 AM

## 2024-01-10 ENCOUNTER — Encounter (HOSPITAL_COMMUNITY): Payer: Self-pay | Admitting: Orthopaedic Surgery

## 2024-01-10 DIAGNOSIS — S72001A Fracture of unspecified part of neck of right femur, initial encounter for closed fracture: Secondary | ICD-10-CM | POA: Diagnosis not present

## 2024-01-10 LAB — CBC WITH DIFFERENTIAL/PLATELET
Abs Immature Granulocytes: 0.03 10*3/uL (ref 0.00–0.07)
Basophils Absolute: 0 10*3/uL (ref 0.0–0.1)
Basophils Relative: 0 %
Eosinophils Absolute: 0 10*3/uL (ref 0.0–0.5)
Eosinophils Relative: 0 %
HCT: 32.6 % — ABNORMAL LOW (ref 36.0–46.0)
Hemoglobin: 11.2 g/dL — ABNORMAL LOW (ref 12.0–15.0)
Immature Granulocytes: 0 %
Lymphocytes Relative: 12 %
Lymphs Abs: 1.2 10*3/uL (ref 0.7–4.0)
MCH: 33.5 pg (ref 26.0–34.0)
MCHC: 34.4 g/dL (ref 30.0–36.0)
MCV: 97.6 fL (ref 80.0–100.0)
Monocytes Absolute: 1.1 10*3/uL — ABNORMAL HIGH (ref 0.1–1.0)
Monocytes Relative: 10 %
Neutro Abs: 8.2 10*3/uL — ABNORMAL HIGH (ref 1.7–7.7)
Neutrophils Relative %: 78 %
Platelets: 186 10*3/uL (ref 150–400)
RBC: 3.34 MIL/uL — ABNORMAL LOW (ref 3.87–5.11)
RDW: 12.3 % (ref 11.5–15.5)
WBC: 10.5 10*3/uL (ref 4.0–10.5)
nRBC: 0 % (ref 0.0–0.2)

## 2024-01-10 LAB — BASIC METABOLIC PANEL
Anion gap: 9 (ref 5–15)
BUN: 11 mg/dL (ref 8–23)
CO2: 24 mmol/L (ref 22–32)
Calcium: 8 mg/dL — ABNORMAL LOW (ref 8.9–10.3)
Chloride: 101 mmol/L (ref 98–111)
Creatinine, Ser: 0.62 mg/dL (ref 0.44–1.00)
GFR, Estimated: >60 mL/min (ref >60)
Glucose, Bld: 143 mg/dL — ABNORMAL HIGH (ref 70–99)
Potassium: 4 mmol/L (ref 3.5–5.1)
Sodium: 134 mmol/L — ABNORMAL LOW (ref 135–145)

## 2024-01-10 LAB — CK: Total CK: 411 U/L — ABNORMAL HIGH (ref 38–234)

## 2024-01-10 MED ORDER — ENOXAPARIN SODIUM 40 MG/0.4ML IJ SOSY: 40.0000 mg | PREFILLED_SYRINGE | Freq: Every day | INTRAMUSCULAR | 0 refills | Status: AC

## 2024-01-10 MED ORDER — OXYCODONE-ACETAMINOPHEN 5-325 MG PO TABS: 1.0000 | ORAL_TABLET | Freq: Two times a day (BID) | ORAL | 0 refills | Status: AC | PRN

## 2024-01-10 NOTE — TOC Initial Note (Addendum)
 Transition of Care [facility]) - Initial/Assessment Note    Patient Details  Name: [name] MRN: [id number] Date of Birth: 10-18-1951  Transition of Care [facility]) CM/SW Contact:    Rosalva Jon Bloch, RN Phone Number: 01/10/2024, 9:47 AM  Clinical Narrative:                 Presents with R hip fx after a fall.         - s/p R THA 1/13  From home alone. PTA independent with ADL's, no DME usage.  Recommendations from PT: SNF placement. Pt states agreeable to SNF /rehab. Preference :[facility] in [place], [phone]. Fax # [phone]. Admission liaison , Charmaine Piety;  [email or web address]. Patient reports that she is currently unable to care for self independently at home given her current physical needs and fall risk. Patient expressed understanding of therapy recommendation and is agreeable to SNF placement at time of discharge. NCM discussed insurance authorization process and provided Medicare SNF ratings list. Patient expressed being hopeful for rehab and to feel better soon. No further questions reported at this time. NCM to continue to follow and assist with discharge planning needs.   01/10/2024 @ 10:29 SNF referral emailed to [email or web address].   Expected Discharge Plan: Home w Home Health Services Barriers to Discharge: Continued Medical Work up   Patient Goals and CMS Choice     Choice offered to / list presented to : Patient      Expected Discharge Plan and Services   Discharge Planning Services: CM Consult   Living arrangements for the past 2 months: Single Family Home                                      Prior Living Arrangements/Services Living arrangements for the past 2 months: Single Family Home Lives with:: Self Patient language and need for interpreter reviewed:: Yes Do you feel safe going back to the place where you live?: Yes      Need for Family Participation in Patient Care: Yes  (Comment) Care giver support system in place?: Yes (comment)   Criminal Activity/Legal Involvement Pertinent to Current Situation/Hospitalization: No - Comment as needed  Activities of Daily Living   ADL Screening (condition at time of admission) Independently performs ADLs?: Yes (appropriate for developmental age) Does the patient have a NEW difficulty with bathing/dressing/toileting/self-feeding that is expected to last >3 days?: Yes (Initiates electronic notice to provider for possible OT consult) Does the patient have a NEW difficulty with getting in/out of bed, walking, or climbing stairs that is expected to last >3 days?: Yes (Initiates electronic notice to provider for possible PT consult) Does the patient have a NEW difficulty with communication that is expected to last >3 days?: No Is the patient deaf or have difficulty hearing?: Yes (left ear with hearing aid) Does the patient have difficulty seeing, even when wearing glasses/contacts?: No Does the patient have difficulty concentrating, remembering, or making decisions?: No  Permission Sought/Granted   Permission granted to share information with : Yes, Verbal Permission Granted  Share Information with NAME: Clayton Hacker  Niece  [phone]           Emotional Assessment Appearance:: Appears stated age Attitude/Demeanor/Rapport: Engaged Affect (typically observed): Accepting Orientation: : Oriented to Place, Oriented to Self, Oriented to  Time, Oriented to Situation Alcohol / Substance Use: Not Applicable Psych Involvement: No (comment)  Admission diagnosis:  Closed displaced fracture of right femoral neck (HCC) [S72.001A] Patient Active Problem List   Diagnosis Date Noted   Closed displaced fracture of right femoral neck (HCC) 01/07/2024   Carpal tunnel syndrome of right wrist 09/12/2018   Pain in right hand 09/12/2018   PCP:  Benjamine Aland, MD Pharmacy:   [facility] [phone] [place], [place] - [address] [place] [address] Phone: [phone] Fax: [phone]     Social Drivers of Health (SDOH) Social History: SDOH Screenings   Food Insecurity: No Food Insecurity (01/07/2024)  Housing: Low Risk  (01/07/2024)  Transportation Needs: No Transportation Needs (01/07/2024)  Utilities: Not At Risk (01/07/2024)  Social Connections: Moderately Integrated (01/07/2024)  Tobacco Use: High Risk (01/09/2024)   SDOH Interventions:     Readmission Risk Interventions     No data to display

## 2024-01-10 NOTE — Progress Notes (Signed)
 PROGRESS NOTE  [name]  DOB: [birth year]  PCP: Benjamine Aland, MD FMW:[id number]  DOA: 01/07/2024  LOS: 3 days  Hospital Day: 4  Brief narrative: [name] is a 73 y.o. female with PMH significant for HTN, HLD, anxiety/depression. Patient lives alone with her dog and at baseline relatively healthy and independent. 1/11, patient presented to the ED for evaluation of right hip pain after a recent fall. On 1/8, she accidentally stepped on her puppies toy, slipped and fell on her right side. She had mild discomfort but was ultimately able to get up.  Pain gradually progressed and on 1/10 when she tried to get up from her couch, her right leg gave out she ended up on the floor.  She was unable to get up until the next day when her brother came to visit her and was ultimately brought to ED.  In the ED, patient was afebrile, initial blood pressure over 200/97, breathing on room air Labs with CK more than 2400, renal function normal Chest x-ray with no active disease X-ray of the pelvis and right femur shows acute fracture of the proximal right femoral head-neck junction with up to 1.6 cm diastasis at the superior aspect of the fracture Admitted to [facility] Orthopedic consulted 1/13, underwent right hip THA  Subjective: Patient was seen and examined this morning.  Sitting up in recliner.  Not in distress.  Patient was pleasantly surprised that she was able to take few steps very next day of surgery  Assessment and plan: Right femoral neck fracture S/p right hip THA -1/13 Dr Jerri Secondary to mechanical fall 3 days prior Imaging and procedure as above. For pain control, currently on scheduled Tylenol , as needed oxycodone  and IV Dilaudid  as needed Bowel regimen with scheduled Senokot and as needed MiraLAX  DVT prophylaxis with Lovenox  subcu Vitamin D  level adequate. Fall precautions PT eval obtained.  SNF recommended  Rhabdomyolysis Due to being on the floor for several  hours. CK level down trended with fluid.  Stop IV fluid today.  Encourage oral hydration Recent Labs  Lab 01/07/24 1256 01/08/24 0445 01/10/24 0552  CKTOTAL 2,436* 1,231* 411*    Essential HTN BP was initially elevated with SBP in the 200s on arrival, likely due to pain Improving gradually. PTA meds- labetalol  200 mg daily and spironolactone  25 mg daily  Continue labetalol .  Aldactone  remains on hold. IV hydralazine as needed.   Hx of hair loss Continue minoxidil  2.5 mg daily   Goals of care   Code Status: Full Code     DVT prophylaxis:  enoxaparin  (LOVENOX ) injection 40 mg Start: 01/10/24 0800 SCDs Start: 01/09/24 1550 Place TED hose Start: 01/09/24 1550Plan to resume Lovenox  postop.   Antimicrobials: None Fluid: Stop IV fluid today Consultants: Orthopedics Family Communication: None at bedside  Status: Inpatient Level of care:  Med-Surg   Patient is from: Home Needs to continue in-hospital care: POD 1, seen by PT.  SNF recommended Anticipated d/c to: SNF      Diet:  Diet Order             Diet Carb Modified Fluid consistency: Thin; Room service appropriate? Yes  Diet effective now                   Scheduled Meds:  acetaminophen   1,000 mg Oral Q6H   docusate sodium   100 mg Oral BID   enoxaparin  (LOVENOX ) injection  40 mg Subcutaneous Q24H   labetalol   200 mg Oral Daily  minoxidil   2.5 mg Oral Daily    PRN meds: acetaminophen , alum & mag hydroxide-simeth, HYDROmorphone  (DILAUDID ) injection, magnesium  citrate, menthol -cetylpyridinium **OR** phenol, methocarbamol  **OR** methocarbamol  (ROBAXIN ) injection, ondansetron  **OR** ondansetron  (ZOFRAN ) IV, oxyCODONE , oxyCODONE , polyethylene glycol, sorbitol    Infusions:     Antimicrobials: Anti-infectives (From admission, onward)    Start     Dose/Rate Route Frequency Ordered Stop   01/09/24 2000  ceFAZolin  (ANCEF ) IVPB 2g/100 mL premix        2 g 200 mL/hr over 30 Minutes Intravenous Every 6  hours 01/09/24 1549 01/10/24 0923   01/09/24 1356  vancomycin  (VANCOCIN ) powder  Status:  Discontinued          As needed 01/09/24 1357 01/09/24 1455   01/09/24 1100  ceFAZolin  (ANCEF ) IVPB 2g/100 mL premix        2 g 200 mL/hr over 30 Minutes Intravenous On call to O.R. 01/09/24 1011 01/09/24 1332       Objective: Vitals:   01/10/24 0531 01/10/24 0746  BP: (!) 132/59 (!) 128/57  Pulse: 77 78  Resp: 18 18  Temp: 97.8 F (36.6 C) 98 F (36.7 C)  SpO2: 100% 100%    Intake/Output Summary (Last 24 hours) at 01/10/2024 1041 Last data filed at 01/10/2024 0700 Gross per 24 hour  Intake 2103.44 ml  Output 2350 ml  Net -246.56 ml   Filed Weights   01/07/24 1240  Weight: 52.6 kg   Weight change:  Body mass index is 21.22 kg/m.   Physical Exam: General exam: Pleasant, not in distress.  Not in pain.  Sitting up in recliner. Skin: No rashes, lesions or ulcers. HEENT: Atraumatic, normocephalic, no obvious bleeding Lungs: Clear to auscultation bilaterally,  CVS: S1, S2, no murmur,   GI/Abd: Soft, nontender, nondistended, bowel sound present,   CNS: Alert, awake, oriented x 3 Psychiatry: Mood appropriate,  Extremities: No pedal edema, no calf tenderness,   Data Review: I have personally reviewed the laboratory data and studies available.  F/u labs  Unresulted Labs (From admission, onward)     Start     Ordered   01/16/24 0500  Creatinine, serum  (enoxaparin  (LOVENOX )  CrCl >/= 30 mL/min  )  Weekly,   R     Comments: while on enoxaparin  therapy.   Question:  Specimen collection method  Answer:  Lab=Lab collect   01/09/24 1549   01/10/24 0500  CBC  Daily,   R     Comments: For 3 days.   Question:  Specimen collection method  Answer:  Lab=Lab collect   01/09/24 1549   01/10/24 0500  Basic metabolic panel  Daily,   R     Comments: For 2 days .   Question:  Specimen collection method  Answer:  Lab=Lab collect   01/09/24 1549            Total time spent in review  of labs and imaging, patient evaluation, formulation of plan, documentation and communication with family: 45 minutes  Signed, Chapman Rota, MD [facility] 01/10/2024

## 2024-01-10 NOTE — Progress Notes (Signed)
 Subjective: 1 Day Post-Op Procedure(s) (LRB): RIGHT TOTAL HIP ARTHROPLASTY (Right) Patient reports pain as mild.    Objective: Vital signs in last 24 hours: Temp:  [97 F (36.1 C)-98.3 F (36.8 C)] 97.8 F (36.6 C) (01/14 0531) Pulse Rate:  [73-96] 77 (01/14 0531) Resp:  [14-18] 18 (01/14 0531) BP: (121-178)/(57-78) 132/59 (01/14 0531) SpO2:  [94 %-100 %] 100 % (01/14 0531)  Intake/Output from previous day: 01/13 0701 - 01/14 0700 In: 2103.4 [I.V.:1903.4; IV Piggyback:200.1] Out: 950 [Urine:850; Blood:100] Intake/Output this shift: No intake/output data recorded.  Recent Labs    01/07/24 1256 01/08/24 0445 01/09/24 0449 01/10/24 0552  HGB 15.0 12.2 11.9* 11.2*   Recent Labs    01/09/24 0449 01/10/24 0552  WBC 6.9 10.5  RBC 3.53* 3.34*  HCT 35.1* 32.6*  PLT 178 186   Recent Labs    01/09/24 0449 01/10/24 0552  NA 135 134*  K 3.6 4.0  CL 104 101  CO2 23 24  BUN 7* 11  CREATININE 0.65 0.62  GLUCOSE 103* 143*  CALCIUM 8.3* 8.0*   No results for input(s): LABPT, INR in the last 72 hours.  Neurologically intact Neurovascular intact Sensation intact distally Intact pulses distally Dorsiflexion/Plantar flexion intact Incision: dressing C/D/I No cellulitis present Compartment soft   Assessment/Plan: 1 Day Post-Op Procedure(s) (LRB): RIGHT TOTAL HIP ARTHROPLASTY (Right) Up with therapy WBAT RLE ABLA- mild and stable Oxy for pain Lovenox /scds for dvt ppx D/c dispo per primary team      [name] 01/10/2024, 7:38 AM

## 2024-01-10 NOTE — NC FL2 (Signed)
 [place]  MEDICAID [id number] LEVEL OF CARE FORM     IDENTIFICATION  Patient Name: [name] Birthdate: August 01, 1951 Sex: female Admission Date (Current Location): 01/07/2024  [facility] and [place] Number:      Facility and Address:         Provider Number: [id number]  Attending Physician Name and Address:  Arlice Reichert, MD  Relative Name and Phone Number:       Current Level of Care: Hospital Recommended Level of Care: Skilled Nursing Facility Prior Approval Number:    Date Approved/Denied:   PASRR Number:    Discharge Plan: SNF    Current Diagnoses: Patient Active Problem List   Diagnosis Date Noted   Closed displaced fracture of right femoral neck (HCC) 01/07/2024   Carpal tunnel syndrome of right wrist 09/12/2018   Pain in right hand 09/12/2018    Orientation RESPIRATION BLADDER Height & Weight     Self, Time, Situation, Place  Normal Continent Weight: 52.6 kg Height:  5' 2 (157.5 cm)  BEHAVIORAL SYMPTOMS/MOOD NEUROLOGICAL BOWEL NUTRITION STATUS      Continent Diet (refer to d/c summary)  AMBULATORY STATUS COMMUNICATION OF NEEDS Skin   Extensive Assist Verbally Normal, Other (Comment) (s/p R THA 1/13)                       Personal Care Assistance Level of Assistance  Bathing, Feeding, Dressing Bathing Assistance: Limited assistance Feeding assistance: Independent Dressing Assistance: Limited assistance     Functional Limitations Info  Sight, Hearing, Speech Sight Info: Adequate Hearing Info: Adequate Speech Info: Adequate    SPECIAL CARE FACTORS FREQUENCY  PT (By licensed PT), OT (By licensed OT)     PT Frequency: 5x/week, evaluate and treat OT Frequency: 5x/week, evaluate and treat            Contractures Contractures Info: Not present    Additional Factors Info  Code Status, Allergies Code Status Info: Full code Allergies Info: No Known Allergies           Current Medications (01/10/2024):  This is the current  hospital active medication list Current Facility-Administered Medications  Medication Dose Route Frequency Provider Last Rate Last Admin   acetaminophen  (TYLENOL ) tablet 1,000 mg  1,000 mg Oral Q6H Jerri Kay HERO, MD   1,000 mg at 01/10/24 0553   acetaminophen  (TYLENOL ) tablet 325-650 mg  325-650 mg Oral Q6H PRN Jerri Kay HERO, MD       alum & mag hydroxide-simeth (MAALOX/MYLANTA) 200-200-20 MG/5ML suspension 30 mL  30 mL Oral Q4H PRN Jerri Kay HERO, MD       docusate sodium  (COLACE) capsule 100 mg  100 mg Oral BID Jerri Kay HERO, MD   100 mg at 01/10/24 9153   enoxaparin  (LOVENOX ) injection 40 mg  40 mg Subcutaneous Q24H Jerri Kay HERO, MD   40 mg at 01/10/24 0830   HYDROmorphone  (DILAUDID ) injection 0.5-1 mg  0.5-1 mg Intravenous Q4H PRN Jerri Kay HERO, MD       labetalol  (NORMODYNE ) tablet 200 mg  200 mg Oral Daily Dahal, Binaya, MD   200 mg at 01/10/24 0930   magnesium  citrate solution 1 Bottle  1 Bottle Oral Once PRN Jerri Kay HERO, MD       menthol -cetylpyridinium (CEPACOL) lozenge 3 mg  1 lozenge Oral PRN Jerri Kay HERO, MD       Or   phenol (CHLORASEPTIC) mouth spray 1 spray  1 spray Mouth/Throat PRN Jerri Kay HERO, MD  methocarbamol  (ROBAXIN ) tablet 500 mg  500 mg Oral Q6H PRN Jerri Kay HERO, MD       Or   methocarbamol  (ROBAXIN ) injection 500 mg  500 mg Intravenous Q6H PRN Jerri Kay HERO, MD       minoxidil  (LONITEN ) tablet 2.5 mg  2.5 mg Oral Daily Jerri Kay HERO, MD   2.5 mg at 01/10/24 9074   ondansetron  (ZOFRAN ) tablet 4 mg  4 mg Oral Q6H PRN Jerri Kay HERO, MD       Or   ondansetron  (ZOFRAN ) injection 4 mg  4 mg Intravenous Q6H PRN Jerri Kay HERO, MD       oxyCODONE  (Oxy IR/ROXICODONE ) immediate release tablet 10-15 mg  10-15 mg Oral Q4H PRN Jerri Kay HERO, MD   15 mg at 01/10/24 9262   oxyCODONE  (Oxy IR/ROXICODONE ) immediate release tablet 5-10 mg  5-10 mg Oral Q4H PRN Jerri Kay HERO, MD       polyethylene glycol (MIRALAX  / GLYCOLAX ) packet 17 g  17 g Oral Daily PRN Jerri Kay HERO, MD        sorbitol  70 % solution 30 mL  30 mL Oral Daily PRN Jerri Kay HERO, MD         Discharge Medications: Please see discharge summary for a list of discharge medications.  Relevant Imaging Results:  Relevant Lab Results:   Additional Information SS# [id number]  Rosalva Jon Bloch, RN

## 2024-01-10 NOTE — Plan of Care (Signed)

## 2024-01-10 NOTE — TOC Progression Note (Addendum)
 Transition of Care [facility]) - Progression Note    Patient Details  Name: [name] MRN: [id number] Date of Birth: 15-Mar-1951  [facility] CM/SW Contact  Bridget Cordella Simmonds, LCSW Phone Number: 01/10/2024, 3:06 PM  Clinical Narrative:   CSW spoke with [facility].  She did receive referral but is currently full with no anticipated DC.  CSW spoke with pt.  Next choices would be [facility] in [place] or [place].  CSW spoke with Grayce Shirk at [place] [facility], [phone], referral emailed to: [email or web address].  1530: Grayce offers bed for [facility] rehab.  Pt informed and does want to accept this offer.    Auth request submitted in [place] and approved: [id number], 3 days: 1/15-1/17.    Expected Discharge Plan: Home w Home Health Services Barriers to Discharge: Continued Medical Work up  Expected Discharge Plan and Services   Discharge Planning Services: CM Consult   Living arrangements for the past 2 months: Single Family Home                                       Social Determinants of Health (SDOH) Interventions SDOH Screenings   Food Insecurity: No Food Insecurity (01/07/2024)  Housing: Low Risk  (01/07/2024)  Transportation Needs: No Transportation Needs (01/07/2024)  Utilities: Not At Risk (01/07/2024)  Social Connections: Moderately Integrated (01/07/2024)  Tobacco Use: High Risk (01/09/2024)    Readmission Risk Interventions     No data to display

## 2024-01-10 NOTE — Care Management Important Message (Signed)
 Important Message  Patient Details  Name: [name] MRN: [id number] Date of Birth: [birth year]   Important Message Given:  Yes - Medicare IM     Dorena Bodo 01/10/2024, 2:19 PM

## 2024-01-10 NOTE — Evaluation (Signed)
 Physical Therapy Evaluation Patient Details Name: [name] MRN: [id number] DOB: 1951/02/10 Today's Date: 01/10/2024  History of Present Illness  Pt is a 73 y.o. female admitted 1/11 following a fall at home. Imaging revealed R hip fx. She underwent R THA 1/13. PMH: anxiety, HTN   Clinical Impression  Pt admitted with above diagnosis. PTA pt lived alone, independent. Pt currently with functional limitations due to the deficits listed below (see PT Problem List). On eval, she required mod assist bed mobility, min assist sit to stand, and min assist amb 20' with RW. Antalgic gait pattern. Distance limited by pain. Fair sitting balance and poor standing balance. Pt will benefit from acute skilled PT to increase their independence and safety with mobility to allow discharge. Upon d/c, pt would benefit from further therapy in inpatient setting, <3 hours/day. Pt prefers SNF in [place] where her family lives.          If plan is discharge home, recommend the following: A little help with walking and/or transfers;A little help with bathing/dressing/bathroom;Assistance with cooking/housework;Assist for transportation   Can travel by private vehicle   Yes    Equipment Recommendations Rolling walker (2 wheels)  Recommendations for Other Services       Functional Status Assessment Patient has had a recent decline in their functional status and demonstrates the ability to make significant improvements in function in a reasonable and predictable amount of time.     Precautions / Restrictions Precautions Precautions: Fall Restrictions Weight Bearing Restrictions Per Provider Order: Yes RLE Weight Bearing Per Provider Order: Weight bearing as tolerated      Mobility  Bed Mobility Overal bed mobility: Needs Assistance Bed Mobility: Supine to Sit     Supine to sit: Mod assist, HOB elevated, Used rails     General bed mobility comments: assist with RLE and trunk, cues for sequencing     Transfers Overall transfer level: Needs assistance Equipment used: Rolling walker (2 wheels) Transfers: Sit to/from Stand Sit to Stand: From elevated surface, Min assist           General transfer comment: cues for hand placement and sequencing    Ambulation/Gait Ambulation/Gait assistance: Min assist Gait Distance (Feet): 20 Feet Assistive device: Rolling walker (2 wheels) Gait Pattern/deviations: Step-to pattern, Antalgic, Decreased weight shift to right Gait velocity: decreased Gait velocity interpretation: <1.8 ft/sec, indicate of risk for recurrent falls   General Gait Details: cues for sequencing and posture  Stairs            Wheelchair Mobility     Tilt Bed    Modified Rankin (Stroke Patients Only)       Balance Overall balance assessment: Needs assistance Sitting-balance support: No upper extremity supported, Feet supported Sitting balance-Leahy Scale: Fair     Standing balance support: Bilateral upper extremity supported, During functional activity, Reliant on assistive device for balance Standing balance-Leahy Scale: Poor                               Pertinent Vitals/Pain Pain Assessment Pain Assessment: 0-10 Pain Score: 7  Pain Descriptors / Indicators: Discomfort, Guarding Pain Intervention(s): Limited activity within patient's tolerance, Monitored during session, Repositioned    Home Living Family/patient expects to be discharged to:: Private residence Living Arrangements: Alone   Type of Home: House Home Access: Level entry       Home Layout: One level Home Equipment: None  Prior Function Prior Level of Function : Independent/Modified Independent;Driving                     Extremity/Trunk Assessment   Upper Extremity Assessment Upper Extremity Assessment: Defer to OT evaluation    Lower Extremity Assessment Lower Extremity Assessment: RLE deficits/detail RLE Deficits / Details: s/p THA, able  to perform ankle pump, sensation intact RLE: Unable to fully assess due to pain    Cervical / Trunk Assessment Cervical / Trunk Assessment: Normal  Communication   Communication Communication: No apparent difficulties  Cognition Arousal: Alert Behavior During Therapy: WFL for tasks assessed/performed Overall Cognitive Status: Within Functional Limits for tasks assessed                                 General Comments: hyperverbose, tangential (baseline). Pt stating I talk a lot so you'll just have to tell me to shut up.        General Comments General comments (skin integrity, edema, etc.): VSS on RA    Exercises     Assessment/Plan    PT Assessment Patient needs continued PT services  PT Problem List Decreased strength;Decreased balance;Decreased knowledge of precautions;Pain;Decreased mobility;Decreased knowledge of use of DME;Decreased activity tolerance       PT Treatment Interventions DME instruction;Functional mobility training;Balance training;Patient/family education;Gait training;Therapeutic activities;Therapeutic exercise    PT Goals (Current goals can be found in the Care Plan section)  Acute Rehab PT Goals Patient Stated Goal: rehab then home PT Goal Formulation: With patient Time For Goal Achievement: 01/24/24 Potential to Achieve Goals: Good    Frequency Min 1X/week     Co-evaluation               AM-PAC PT 6 Clicks Mobility  Outcome Measure Help needed turning from your back to your side while in a flat bed without using bedrails?: A Lot Help needed moving from lying on your back to sitting on the side of a flat bed without using bedrails?: A Lot Help needed moving to and from a bed to a chair (including a wheelchair)?: A Little Help needed standing up from a chair using your arms (e.g., wheelchair or bedside chair)?: A Little Help needed to walk in hospital room?: A Little Help needed climbing 3-5 steps with a railing? :  Total 6 Click Score: 14    End of Session Equipment Utilized During Treatment: Gait belt Activity Tolerance: Patient tolerated treatment well Patient left: in chair;with call bell/phone within reach;with chair alarm set Nurse Communication: Mobility status PT Visit Diagnosis: Other abnormalities of gait and mobility (R26.89);Pain Pain - Right/Left: Right Pain - part of body: Hip    Time: 9255-9185 PT Time Calculation (min) (ACUTE ONLY): 30 min   Charges:   PT Evaluation $PT Eval Moderate Complexity: 1 Mod PT Treatments $Gait Training: 8-22 mins PT General Charges $$ ACUTE PT VISIT: 1 Visit         Sari MATSU., PT  Office # [phone]   Erven Sari Shaker 01/10/2024, 8:53 AM

## 2024-01-10 NOTE — Evaluation (Signed)
 Occupational Therapy Evaluation Patient Details Name: [name] MRN: [id number] DOB: December 08, 1951 Today's Date: 01/10/2024   History of Present Illness Pt is a 73 y.o. female admitted 1/11 following a fall at home. Imaging revealed R hip fx. She underwent R THA 1/13. PMH: anxiety, HTN   Clinical Impression   Pt c/o 9/10 pain at rest to R hip, felt nauseous at the end of session. Pt PLOF independent, lives alone, states her brother and niece could support almost all day each day if needed. Pt currently set up/supervision for ADLs, min A for LB dressing don/doff R sock/shoe. Pt would benefit from RW and shower seat upon return home. Currently unclear how much support Pt will have a DC, she states family will be able to assist with meals and set up as needed, if not, recommending postacute rehab <3hrs/day. If Pt improves and has consistent support at home, may return home. Will continue to follow Pt acutely to progress as able.       If plan is discharge home, recommend the following: A little help with walking and/or transfers;A little help with bathing/dressing/bathroom;Assistance with cooking/housework;Assist for transportation;Help with stairs or ramp for entrance    Functional Status Assessment  Patient has had a recent decline in their functional status and demonstrates the ability to make significant improvements in function in a reasonable and predictable amount of time.  Equipment Recommendations  Tub/shower seat;Other (comment) (RW)    Recommendations for Other Services       Precautions / Restrictions Precautions Precautions: Fall Restrictions Weight Bearing Restrictions Per Provider Order: Yes RLE Weight Bearing Per Provider Order: Weight bearing as tolerated      Mobility Bed Mobility Overal bed mobility: Needs Assistance             General bed mobility comments: in recliner    Transfers Overall transfer level: Needs assistance Equipment used: Rolling  walker (2 wheels) Transfers: Sit to/from Stand, Bed to chair/wheelchair/BSC Sit to Stand: Supervision     Step pivot transfers: Supervision     General transfer comment: superivsion for safety with RW      Balance Overall balance assessment: Needs assistance Sitting-balance support: No upper extremity supported, Feet supported Sitting balance-Leahy Scale: Good Sitting balance - Comments: sitting in recliner ADLs   Standing balance support: No upper extremity supported, During functional activity Standing balance-Leahy Scale: Good Standing balance comment: able to stand and take steps without RW, able to stand unsupported performing UB ADLs, encouraged to use RW                           ADL either performed or assessed with clinical judgement   ADL Overall ADL's : Needs assistance/impaired Eating/Feeding: Independent   Grooming: Set up   Upper Body Bathing: Set up   Lower Body Bathing: Set up   Upper Body Dressing : Set up   Lower Body Dressing: Minimal assistance   Toilet Transfer: Supervision/safety;Rolling walker (2 wheels)   Toileting- Clothing Manipulation and Hygiene: Set up   Tub/ Shower Transfer: Set up;Rolling walker (2 wheels);Shower seat   Functional mobility during ADLs: Supervision/safety;Rolling walker (2 wheels) General ADL Comments: set up/superivsion for most ADLs and mobility, min A for LB dressing. Pt doing well, able to stand unsupported and take steps without RW, encouraged use of RW for safety.     Vision Baseline Vision/History: 1 Wears glasses Ability to See in Adequate Light: 0 Adequate Patient Visual Report: No  change from baseline       Perception         Praxis         Pertinent Vitals/Pain Pain Assessment Pain Assessment: 0-10 Pain Score: 9  Pain Location: R leg Pain Descriptors / Indicators: Discomfort, Guarding Pain Intervention(s): Monitored during session     Extremity/Trunk Assessment Upper Extremity  Assessment Upper Extremity Assessment: Overall WFL for tasks assessed           Communication Communication Communication: No apparent difficulties   Cognition Arousal: Alert Behavior During Therapy: WFL for tasks assessed/performed Overall Cognitive Status: Within Functional Limits for tasks assessed                                       General Comments       Exercises     Shoulder Instructions      Home Living Family/patient expects to be discharged to:: Private residence Living Arrangements: Alone Available Help at Discharge: Family;Available PRN/intermittently Type of Home: House Home Access: Level entry     Home Layout: One level     Bathroom Shower/Tub: Tub/shower unit         Home Equipment: None   Additional Comments: Pt lives alone but her brother and niece can assist frequently each day      Prior Functioning/Environment Prior Level of Function : Independent/Modified Independent;Driving                        OT Problem List: Decreased strength;Decreased activity tolerance;Decreased range of motion;Impaired balance (sitting and/or standing);Pain      OT Treatment/Interventions: Self-care/ADL training;Therapeutic exercise;Energy conservation;DME and/or AE instruction;Therapeutic activities;Patient/family education    OT Goals(Current goals can be found in the care plan section) Acute Rehab OT Goals Patient Stated Goal: to return home OT Goal Formulation: With patient Time For Goal Achievement: 01/24/24 Potential to Achieve Goals: Good  OT Frequency: Min 1X/week    Co-evaluation              AM-PAC OT 6 Clicks Daily Activity     Outcome Measure Help from another person eating meals?: None Help from another person taking care of personal grooming?: A Little Help from another person toileting, which includes using toliet, bedpan, or urinal?: A Little Help from another person bathing (including washing, rinsing,  drying)?: A Little Help from another person to put on and taking off regular upper body clothing?: A Little Help from another person to put on and taking off regular lower body clothing?: A Little 6 Click Score: 19   End of Session Equipment Utilized During Treatment: Gait belt;Rolling walker (2 wheels) Nurse Communication: Mobility status  Activity Tolerance: Patient tolerated treatment well Patient left: in chair;with call bell/phone within reach;with chair alarm set  OT Visit Diagnosis: Unsteadiness on feet (R26.81);Other abnormalities of gait and mobility (R26.89);Muscle weakness (generalized) (M62.81);Pain Pain - Right/Left: Right Pain - part of body: Hip                Time: 1223-1249 OT Time Calculation (min): 26 min Charges:  OT General Charges $OT Visit: 1 Visit OT Evaluation $OT Eval Low Complexity: 1 Low OT Treatments $Self Care/Home Management : 8-22 mins  [place], OTR/L   Elouise JONELLE Bott 01/10/2024, 1:30 PM

## 2024-01-10 NOTE — Plan of Care (Signed)
  Problem: Education: Goal: Knowledge of General Education information will improve Description: Including pain rating scale, medication(s)/side effects and non-pharmacologic comfort measures Outcome: Progressing   Problem: Activity: Goal: Risk for activity intolerance will decrease Outcome: Progressing   Problem: Elimination: Goal: Will not experience complications related to bowel motility Outcome: Progressing   Problem: Pain Management: Goal: General experience of comfort will improve Outcome: Progressing

## 2024-01-11 DIAGNOSIS — M6281 Muscle weakness (generalized): Secondary | ICD-10-CM | POA: Diagnosis not present

## 2024-01-11 DIAGNOSIS — S72001A Fracture of unspecified part of neck of right femur, initial encounter for closed fracture: Secondary | ICD-10-CM | POA: Diagnosis not present

## 2024-01-11 DIAGNOSIS — S72001D Fracture of unspecified part of neck of right femur, subsequent encounter for closed fracture with routine healing: Secondary | ICD-10-CM | POA: Diagnosis not present

## 2024-01-11 DIAGNOSIS — I10 Essential (primary) hypertension: Secondary | ICD-10-CM | POA: Diagnosis not present

## 2024-01-11 DIAGNOSIS — K5903 Drug induced constipation: Secondary | ICD-10-CM | POA: Diagnosis not present

## 2024-01-11 DIAGNOSIS — L639 Alopecia areata, unspecified: Secondary | ICD-10-CM | POA: Diagnosis not present

## 2024-01-11 DIAGNOSIS — G4700 Insomnia, unspecified: Secondary | ICD-10-CM | POA: Diagnosis not present

## 2024-01-11 DIAGNOSIS — R262 Difficulty in walking, not elsewhere classified: Secondary | ICD-10-CM | POA: Diagnosis not present

## 2024-01-11 DIAGNOSIS — Z9189 Other specified personal risk factors, not elsewhere classified: Secondary | ICD-10-CM | POA: Diagnosis not present

## 2024-01-11 LAB — BASIC METABOLIC PANEL
Anion gap: 7 (ref 5–15)
BUN: 7 mg/dL — ABNORMAL LOW (ref 8–23)
CO2: 27 mmol/L (ref 22–32)
Calcium: 8.2 mg/dL — ABNORMAL LOW (ref 8.9–10.3)
Chloride: 102 mmol/L (ref 98–111)
Creatinine, Ser: 0.57 mg/dL (ref 0.44–1.00)
GFR, Estimated: >60 mL/min (ref >60)
Glucose, Bld: 111 mg/dL — ABNORMAL HIGH (ref 70–99)
Potassium: 3.8 mmol/L (ref 3.5–5.1)
Sodium: 136 mmol/L (ref 135–145)

## 2024-01-11 LAB — CBC
HCT: 30.7 % — ABNORMAL LOW (ref 36.0–46.0)
Hemoglobin: 10.5 g/dL — ABNORMAL LOW (ref 12.0–15.0)
MCH: 33.8 pg (ref 26.0–34.0)
MCHC: 34.2 g/dL (ref 30.0–36.0)
MCV: 98.7 fL (ref 80.0–100.0)
Platelets: 194 10*3/uL (ref 150–400)
RBC: 3.11 MIL/uL — ABNORMAL LOW (ref 3.87–5.11)
RDW: 12.4 % (ref 11.5–15.5)
WBC: 8.4 10*3/uL (ref 4.0–10.5)
nRBC: 0 % (ref 0.0–0.2)

## 2024-01-11 MED ORDER — ACETAMINOPHEN 325 MG PO TABS: 325.0000 mg | ORAL_TABLET | Freq: Four times a day (QID) | ORAL | Status: AC | PRN

## 2024-01-11 MED ORDER — POLYETHYLENE GLYCOL 3350 17 G PO PACK: 17.0000 g | PACK | Freq: Every day | ORAL | Status: AC | PRN

## 2024-01-11 MED ORDER — SORBITOL 70 % SOLN: 30.0000 mL | Freq: Every day | Status: AC | PRN

## 2024-01-11 MED ORDER — LABETALOL HCL 200 MG PO TABS: 200.0000 mg | ORAL_TABLET | Freq: Every day | ORAL | Status: AC

## 2024-01-11 MED ORDER — MAGNESIUM CITRATE PO SOLN: 1.0000 | Freq: Once | ORAL | Status: AC | PRN

## 2024-01-11 MED ORDER — DOCUSATE SODIUM 100 MG PO CAPS: 100.0000 mg | ORAL_CAPSULE | Freq: Two times a day (BID) | ORAL | Status: AC

## 2024-01-11 NOTE — Plan of Care (Signed)

## 2024-01-11 NOTE — Progress Notes (Signed)
 Physical Therapy Treatment Patient Details Name: [name] MRN: [id number] DOB: 1951/06/19 Today's Date: 01/11/2024   History of Present Illness Pt is a 73 y.o. female admitted 1/11 following a fall at home. Imaging revealed R hip fx. She underwent R THA 1/13. PMH: anxiety, HTN    PT Comments  Pt in bed upon arrival and agreeable to PT session. Worked on gait training and LE strength in today's session. Pt continues to have difficulty moving R LE and requires MinA for management with bed mobility. Pt was able to improve by needing less assistance (supervision) to stand. Pt was also able to increase distance while ambulating to ~100 ft with CGA for safety. Pt declined <3hrs post acute rehab, so recommending HHPT upon discharge home. Pt is progressing well towards goals. Acute PT to follow.      If plan is discharge home, recommend the following: A little help with walking and/or transfers;A little help with bathing/dressing/bathroom;Assistance with cooking/housework;Assist for transportation   Can travel by private vehicle     Yes  Equipment Recommendations  Rolling walker (2 wheels)       Precautions / Restrictions Precautions Precautions: Fall Restrictions Weight Bearing Restrictions Per Provider Order: Yes RLE Weight Bearing Per Provider Order: Weight bearing as tolerated     Mobility  Bed Mobility Overal bed mobility: Needs Assistance Bed Mobility: Supine to Sit    Supine to sit: Min assist, HOB elevated, Used rails    General bed mobility comments: MinA for R LE management    Transfers Overall transfer level: Needs assistance Equipment used: Rolling walker (2 wheels) Transfers: Sit to/from Stand Sit to Stand: Supervision    General transfer comment: supervision for safety, good recall with hand placement    Ambulation/Gait Ambulation/Gait assistance: Contact guard assist Gait Distance (Feet): 100 Feet Assistive device: Rolling walker (2 wheels) Gait  Pattern/deviations: Step-to pattern, Antalgic, Decreased weight shift to right Gait velocity: decr     General Gait Details: step to pattern with R LE leading        Balance Overall balance assessment: Needs assistance Sitting-balance support: No upper extremity supported, Feet supported Sitting balance-Leahy Scale: Good     Standing balance support: No upper extremity supported, During functional activity Standing balance-Leahy Scale: Good       Cognition Arousal: Alert Behavior During Therapy: WFL for tasks assessed/performed Overall Cognitive Status: Within Functional Limits for tasks assessed       Exercises General Exercises - Lower Extremity Long Arc Quad: AROM, Both, 10 reps, Seated Hip Flexion/Marching: AROM, Both, 10 reps, Seated    General Comments General comments (skin integrity, edema, etc.): VSS on RA      Pertinent Vitals/Pain Pain Assessment Pain Assessment: Faces Faces Pain Scale: Hurts little more Pain Location: R leg Pain Descriptors / Indicators: Discomfort, Guarding Pain Intervention(s): Limited activity within patient's tolerance, Monitored during session, Repositioned     PT Goals (current goals can now be found in the care plan section) Acute Rehab PT Goals PT Goal Formulation: With patient Time For Goal Achievement: 01/24/24 Potential to Achieve Goals: Good Progress towards PT goals: Progressing toward goals    Frequency    Min 1X/week       AM-PAC PT "6 Clicks" Mobility   Outcome Measure  Help needed turning from your back to your side while in a flat bed without using bedrails?: A Little Help needed moving from lying on your back to sitting on the side of a flat bed without using bedrails?:  A Little Help needed moving to and from a bed to a chair (including a wheelchair)?: A Little Help needed standing up from a chair using your arms (e.g., wheelchair or bedside chair)?: A Little Help needed to walk in hospital room?: A  Little Help needed climbing 3-5 steps with a railing? : A Lot 6 Click Score: 17    End of Session Equipment Utilized During Treatment: Gait belt Activity Tolerance: Patient tolerated treatment well Patient left: in chair;with call bell/phone within reach;with chair alarm set Nurse Communication: Mobility status PT Visit Diagnosis: Other abnormalities of gait and mobility (R26.89);Pain Pain - Right/Left: Right Pain - part of body: Hip     Time: 9562-1308 PT Time Calculation (min) (ACUTE ONLY): 27 min  Charges:    $Gait Training: 8-22 mins $Therapeutic Exercise: 8-22 mins PT General Charges $$ ACUTE PT VISIT: 1 Visit                     Orysia Blas, PT, DPT Secure Chat Preferred  Rehab Office [phone]   [name] 01/11/2024, 11:11 AM

## 2024-01-11 NOTE — TOC Transition Note (Signed)
 Transition of Care [facility]) - Discharge Note   Patient Details  Name: [name] MRN: [id number] Date of Birth: May 29, 1951  [facility] CM/SW Contact:  Elspeth Hals, LCSW Phone Number: 01/11/2024, 12:11 PM   Clinical Narrative:   Pt discharging to  [facility], [place], [place] .  RN call report to [phone].   1145: CSW confirmed with Robin/Chatham that she can receive pt.  DC summary emailed.     Final next level of care: Skilled Nursing Facility Barriers to Discharge: Barriers Resolved   Patient Goals and CMS Choice     Choice offered to / list presented to : Patient      Discharge Placement              Patient chooses bed at:  [facility] and [place], [place]) Patient to be transferred to facility by: brother Dalbert Dubois Name of family member notified: brother Dalbert Dubois Patient and family notified of of transfer: 01/11/24  Discharge Plan and Services Additional resources added to the After Visit Summary for     Discharge Planning Services: CM Consult                                 Social Drivers of Health (SDOH) Interventions SDOH Screenings   Food Insecurity: No Food Insecurity (01/07/2024)  Housing: Low Risk  (01/07/2024)  Transportation Needs: No Transportation Needs (01/07/2024)  Utilities: Not At Risk (01/07/2024)  Social Connections: Moderately Integrated (01/07/2024)  Tobacco Use: High Risk (01/09/2024)     Readmission Risk Interventions     No data to display

## 2024-01-11 NOTE — Progress Notes (Signed)
 Subjective: 2 Days Post-Op Procedure(s) (LRB): RIGHT TOTAL HIP ARTHROPLASTY (Right) Patient reports pain as mild.    Objective: Vital signs in last 24 hours: Temp:  [98 F (36.7 C)-99 F (37.2 C)] 98.8 F (37.1 C) (01/15 0746) Pulse Rate:  [68-87] 87 (01/15 0746) Resp:  [18] 18 (01/15 0746) BP: (106-155)/(69-91) 119/91 (01/15 0746) SpO2:  [96 %-100 %] 100 % (01/15 0746)  Intake/Output from previous day: No intake/output data recorded. Intake/Output this shift: No intake/output data recorded.  Recent Labs    01/09/24 0449 01/10/24 0552 01/11/24 0659  HGB 11.9* 11.2* 10.5*   Recent Labs    01/10/24 0552 01/11/24 0659  WBC 10.5 8.4  RBC 3.34* 3.11*  HCT 32.6* 30.7*  PLT 186 194   Recent Labs    01/09/24 0449 01/10/24 0552  NA 135 134*  K 3.6 4.0  CL 104 101  CO2 23 24  BUN 7* 11  CREATININE 0.65 0.62  GLUCOSE 103* 143*  CALCIUM 8.3* 8.0*   No results for input(s): "LABPT", "INR" in the last 72 hours.  Neurologically intact Neurovascular intact Sensation intact distally Intact pulses distally Dorsiflexion/Plantar flexion intact Incision: dressing C/D/I No cellulitis present Compartment soft   Assessment/Plan: 2 Days Post-Op Procedure(s) (LRB): RIGHT TOTAL HIP ARTHROPLASTY (Right) Up with therapy Weightbearing: WBAT RLE Insicional and dressing care: Dressings left intact until follow-up Orthopedic device(s): None Showering: pod #3 VTE prophylaxis: Lovenox  40mg  qd  x 2 weeks Pain control: oxycodone  Follow - up plan: 2 weeks Contact information:  xu MD, Flint Hummer PA      [name] 01/11/2024, 7:52 AM

## 2024-01-11 NOTE — Progress Notes (Signed)
 Report given to [facility].

## 2024-01-11 NOTE — Discharge Summary (Signed)
 DISCHARGE SUMMARY  [name]  MR#: [id number]  DOB:[birth year]  Date of Admission: 01/07/2024 Date of Discharge: 01/11/2024  Attending Physician:Ulis Kaps Constantine Delude, MD  Patient's WUJ:[id number], Lori Crest, MD  Disposition: D/C to SNF for rehab stay   Follow-up Appts:  Follow-up Information     Lori Cross, PA-C Follow up in 2 week(s).   Specialty: Orthopedic Surgery Why: For suture removal, For wound re-check Contact information: [address] [place]  [place] [address] [phone]                 Discharge Diagnoses: Right femoral neck fracture Rhabdomyolysis Essential HTN Alopecia  Initial presentation: 73 year old with a history of HTN, HLD, and anxiety/depression who suffered a mechanical fall after which she experienced significant right hip pain. She presented to the ER 1/11. At presentation she was hypertensive at 200/97 and CK was found to be elevated 2400. Plain films revealed an acute fracture of the proximal right femoral head/neck junction.   Hospital Course:  Right femoral neck fracture Status post right total hip arthroplasty 11/13 per Dr. Christiane Cowing -care per Orthopedics postoperatively - medically ready for d/c to SNF rehab stay - Ortho instructions for d/c as follows:   Weightbearing: WBAT RLE Insicional and dressing care: Dressings left intact until follow-up Orthopedic device(s): None Showering: pod #3 VTE prophylaxis: Lovenox  40mg  qd  x 2 weeks Pain control: oxycodone  Follow - up plan: 2 weeks Contact information:  Christiane Cowing MD, Flint Hummer PA   Rhabdomyolysis Due to a fall and being down on the floor for several hours - resolved with volume resuscitation   Essential HTN Initial elevated blood pressure likely due to pain - BP well controlled at time of d/c    Alopecia Continue minoxidil   Allergies as of 01/11/2024   No Known Allergies      Medication List     TAKE these medications    acetaminophen  325 MG tablet Commonly known as:  TYLENOL  Take 1-2 tablets (325-650 mg total) by mouth every 6 (six) hours as needed for mild pain (pain score 1-3) (or temp > 100.5).   docusate sodium  100 MG capsule Commonly known as: COLACE Take 1 capsule (100 mg total) by mouth 2 (two) times daily.   enoxaparin  40 MG/0.4ML injection Commonly known as: LOVENOX  Inject 0.4 mLs (40 mg total) into the skin daily for 14 days.   labetalol  200 MG tablet Commonly known as: NORMODYNE  Take 1 tablet (200 mg total) by mouth daily. Start taking on: January 12, 2024 What changed: when to take this   magnesium  citrate Soln Take 296 mLs (1 Bottle total) by mouth once as needed for severe constipation.   minoxidil  2.5 MG tablet Commonly known as: LONITEN  Take 2.5 mg by mouth daily.   oxyCODONE -acetaminophen  5-325 MG tablet Commonly known as: Percocet Take 1-2 tablets by mouth 2 (two) times daily as needed for severe pain (pain score 7-10).   polyethylene glycol 17 g packet Commonly known as: MIRALAX  / GLYCOLAX  Take 17 g by mouth daily as needed for mild constipation.   sorbitol  70 % Soln Take 30 mLs by mouth daily as needed for moderate constipation.   spironolactone  25 MG tablet Commonly known as: ALDACTONE  Take 25 mg by mouth daily.               Discharge Care Instructions  (From admission, onward)           Start     Ordered   01/09/24 0000  Weight bearing as tolerated  01/09/24 1243            Day of Discharge BP (!) 119/91 (BP Location: Right Arm)   Pulse 87   Temp 98.8 F (37.1 C) (Oral)   Resp 18   Ht 5\' 2"  (1.575 m)   Wt 52.6 kg   SpO2 100%   BMI 21.22 kg/m   Physical Exam: General: No acute respiratory distress Lungs: Clear to auscultation bilaterally without wheezes or crackles Cardiovascular: Regular rate and rhythm without murmur gallop or rub normal S1 and S2 Abdomen: Nontender, nondistended, soft, bowel sounds positive, no rebound, no ascites, no appreciable mass Extremities: No  significant cyanosis, clubbing, or edema bilateral lower extremities  Basic Metabolic Panel: Recent Labs  Lab 01/07/24 1256 01/08/24 0445 01/09/24 0449 01/10/24 0552 01/11/24 0659  NA 140 138 135 134* 136  K 4.1 3.7 3.6 4.0 3.8  CL 104 107 104 101 102  CO2 18* 21* 23 24 27   GLUCOSE 129* 111* 103* 143* 111*  BUN 19 19 7* 11 7*  CREATININE 0.86 0.69 0.65 0.62 0.57  CALCIUM 9.7 8.5* 8.3* 8.0* 8.2*    CBC: Recent Labs  Lab 01/07/24 1256 01/08/24 0445 01/09/24 0449 01/10/24 0552 01/11/24 0659  WBC 11.1* 8.9 6.9 10.5 8.4  NEUTROABS 8.6*  --  3.8 8.2*  --   HGB 15.0 12.2 11.9* 11.2* 10.5*  HCT 44.1 35.9* 35.1* 32.6* 30.7*  MCV 98.9 98.6 99.4 97.6 98.7  PLT 216 181 178 186 194     Time spent in discharge (includes decision making & examination of pt): 35 minutes  01/11/2024, 11:40 AM   Lori Abate, MD [facility] Office  [phone]

## 2024-01-12 DIAGNOSIS — I10 Essential (primary) hypertension: Secondary | ICD-10-CM | POA: Diagnosis not present

## 2024-01-17 DIAGNOSIS — K5903 Drug induced constipation: Secondary | ICD-10-CM | POA: Diagnosis not present

## 2024-01-17 DIAGNOSIS — S72001D Fracture of unspecified part of neck of right femur, subsequent encounter for closed fracture with routine healing: Secondary | ICD-10-CM | POA: Diagnosis not present

## 2024-01-17 DIAGNOSIS — I10 Essential (primary) hypertension: Secondary | ICD-10-CM | POA: Diagnosis not present

## 2024-01-20 DIAGNOSIS — R262 Difficulty in walking, not elsewhere classified: Secondary | ICD-10-CM | POA: Diagnosis not present

## 2024-01-20 DIAGNOSIS — I10 Essential (primary) hypertension: Secondary | ICD-10-CM | POA: Diagnosis not present

## 2024-01-20 DIAGNOSIS — S72001D Fracture of unspecified part of neck of right femur, subsequent encounter for closed fracture with routine healing: Secondary | ICD-10-CM | POA: Diagnosis not present

## 2024-01-20 DIAGNOSIS — K5903 Drug induced constipation: Secondary | ICD-10-CM | POA: Diagnosis not present

## 2024-01-20 DIAGNOSIS — M6281 Muscle weakness (generalized): Secondary | ICD-10-CM | POA: Diagnosis not present

## 2024-01-22 DIAGNOSIS — Z96641 Presence of right artificial hip joint: Secondary | ICD-10-CM | POA: Insufficient documentation

## 2024-01-22 NOTE — Progress Notes (Unsigned)
   Post-Op Visit Note   Patient: [name]           Date of Birth: 1951-07-25           MRN: [id number] Visit Date: 01/24/2024 PCP: Renaye Rakers, MD   Assessment & Plan:  Chief Complaint: No chief complaint on file.  Visit Diagnoses:  1. Closed displaced fracture of right femoral neck (HCC)   2. Status post total replacement of right hip     Plan: ***  Follow-Up Instructions: No follow-ups on file.   Orders:  No orders of the defined types were placed in this encounter.  No orders of the defined types were placed in this encounter.   Imaging: No results found.  PMFS History: Patient Active Problem List   Diagnosis Date Noted   Status post total replacement of right hip 01/22/2024   Closed displaced fracture of right femoral neck (HCC) 01/07/2024   Carpal tunnel syndrome of right wrist 09/12/2018   Pain in right hand 09/12/2018   Past Medical History:  Diagnosis Date   Anxiety    Breast cyst 1982   Depression    Eczema    Hypertension    Lipid disorder    Mood swings     Family History  Problem Relation Age of Onset   Breast cancer Maternal Aunt    Hypertension Mother    Cancer Mother        cervical   Diabetes Mother    Hypertension Father    Diabetes Father    Diabetes Sister    Hypertension Sister    Diabetes Brother    Hypertension Brother     Past Surgical History:  Procedure Laterality Date   BREAST EXCISIONAL BIOPSY Left 1981   CARPAL TUNNEL RELEASE  2007   TOTAL HIP ARTHROPLASTY Right 01/09/2024   Procedure: RIGHT TOTAL HIP ARTHROPLASTY;  Surgeon: Tarry Kos, MD;  Location: [facility] OR;  Service: Orthopedics;  Laterality: Right;   Social History   Occupational History   Not on file  Tobacco Use   Smoking status: Every Day    Types: Cigarettes   Smokeless tobacco: Never  Vaping Use   Vaping status: Never Used  Substance and Sexual Activity   Alcohol use: Yes    Alcohol/week: 3.0 standard drinks of alcohol    Types: 3 Cans  of beer per week    Comment: daily   Drug use: Never   Sexual activity: Not on file

## 2024-01-24 ENCOUNTER — Encounter: Payer: Self-pay | Admitting: Orthopaedic Surgery

## 2024-01-24 ENCOUNTER — Telehealth: Payer: Self-pay | Admitting: Orthopaedic Surgery

## 2024-01-24 ENCOUNTER — Other Ambulatory Visit (INDEPENDENT_AMBULATORY_CARE_PROVIDER_SITE_OTHER): Payer: Medicare Other

## 2024-01-24 ENCOUNTER — Ambulatory Visit (INDEPENDENT_AMBULATORY_CARE_PROVIDER_SITE_OTHER): Payer: Medicare Other | Admitting: Orthopaedic Surgery

## 2024-01-24 DIAGNOSIS — S72001A Fracture of unspecified part of neck of right femur, initial encounter for closed fracture: Secondary | ICD-10-CM

## 2024-01-24 DIAGNOSIS — Z96641 Presence of right artificial hip joint: Secondary | ICD-10-CM

## 2024-01-24 NOTE — Telephone Encounter (Signed)
Yes that's fine with me as long as she feels comfortable doing that

## 2024-01-24 NOTE — Addendum Note (Signed)
Addended by: Albertina Parr on: 01/24/2024 11:16 AM   Modules accepted: Orders

## 2024-01-24 NOTE — Telephone Encounter (Signed)
Patient called would like to know if it is ok to go home and be by herself? [phone] is the cb #

## 2024-01-25 NOTE — Telephone Encounter (Signed)
Called and notified patient.

## 2024-02-21 ENCOUNTER — Encounter: Payer: Medicare Other | Admitting: Physician Assistant

## 2024-03-22 DIAGNOSIS — G4700 Insomnia, unspecified: Secondary | ICD-10-CM | POA: Diagnosis not present

## 2024-03-22 DIAGNOSIS — R7309 Other abnormal glucose: Secondary | ICD-10-CM | POA: Diagnosis not present

## 2024-03-22 DIAGNOSIS — E782 Mixed hyperlipidemia: Secondary | ICD-10-CM | POA: Diagnosis not present

## 2024-03-22 DIAGNOSIS — I10 Essential (primary) hypertension: Secondary | ICD-10-CM | POA: Diagnosis not present

## 2024-03-22 DIAGNOSIS — Z6820 Body mass index (BMI) 20.0-20.9, adult: Secondary | ICD-10-CM | POA: Diagnosis not present

## 2024-07-23 DIAGNOSIS — R413 Other amnesia: Secondary | ICD-10-CM | POA: Diagnosis not present

## 2024-07-23 DIAGNOSIS — E782 Mixed hyperlipidemia: Secondary | ICD-10-CM | POA: Diagnosis not present

## 2024-07-23 DIAGNOSIS — G4700 Insomnia, unspecified: Secondary | ICD-10-CM | POA: Diagnosis not present

## 2024-07-23 DIAGNOSIS — R7309 Other abnormal glucose: Secondary | ICD-10-CM | POA: Diagnosis not present

## 2024-07-23 DIAGNOSIS — I10 Essential (primary) hypertension: Secondary | ICD-10-CM | POA: Diagnosis not present

## 2024-07-23 DIAGNOSIS — E7420 Disorders of galactose metabolism, unspecified: Secondary | ICD-10-CM | POA: Diagnosis not present

## 2024-07-23 DIAGNOSIS — R7303 Prediabetes: Secondary | ICD-10-CM | POA: Diagnosis not present

## 2024-08-20 ENCOUNTER — Other Ambulatory Visit (HOSPITAL_BASED_OUTPATIENT_CLINIC_OR_DEPARTMENT_OTHER): Payer: Self-pay | Admitting: Family Medicine

## 2024-08-20 DIAGNOSIS — Z1231 Encounter for screening mammogram for malignant neoplasm of breast: Secondary | ICD-10-CM

## 2024-09-01 ENCOUNTER — Ambulatory Visit (HOSPITAL_BASED_OUTPATIENT_CLINIC_OR_DEPARTMENT_OTHER): Admission: RE | Admit: 2024-09-01 | Source: Ambulatory Visit | Admitting: Radiology

## 2024-09-04 ENCOUNTER — Ambulatory Visit
Admission: RE | Admit: 2024-09-04 | Discharge: 2024-09-04 | Disposition: A | Discharge status: Home / Self Care | Source: Ambulatory Visit | Attending: Family Medicine | Admitting: Family Medicine

## 2024-09-04 DIAGNOSIS — Z1231 Encounter for screening mammogram for malignant neoplasm of breast: Secondary | ICD-10-CM | POA: Diagnosis not present

## 2024-09-10 DIAGNOSIS — E782 Mixed hyperlipidemia: Secondary | ICD-10-CM | POA: Diagnosis not present

## 2024-09-10 DIAGNOSIS — R634 Abnormal weight loss: Secondary | ICD-10-CM | POA: Diagnosis not present

## 2024-09-10 DIAGNOSIS — I9589 Other hypotension: Secondary | ICD-10-CM | POA: Diagnosis not present

## 2024-09-27 DIAGNOSIS — Z720 Tobacco use: Secondary | ICD-10-CM | POA: Diagnosis not present

## 2024-09-27 DIAGNOSIS — R413 Other amnesia: Secondary | ICD-10-CM | POA: Diagnosis not present

## 2024-09-27 DIAGNOSIS — I9589 Other hypotension: Secondary | ICD-10-CM | POA: Diagnosis not present

## 2024-10-29 ENCOUNTER — Encounter: Payer: Self-pay | Admitting: Radiology

## 2025-02-01 ENCOUNTER — Ambulatory Visit

## 2025-02-01 ENCOUNTER — Ambulatory Visit: Admitting: Podiatry

## 2025-02-01 DIAGNOSIS — M21611 Bunion of right foot: Secondary | ICD-10-CM

## 2025-02-01 NOTE — Patient Instructions (Signed)
 You can use UREA 40% CREAM  on the thicker toenails

## 2025-02-01 NOTE — Progress Notes (Unsigned)
 Chief Complaint  Patient presents with   Foot Pain    Patient presents today complaining of bilateral hallux toe pain. She reports significant discomfort, describing the sensation as the bone growing out of the nail. Symptoms have been occurring intermittently for approximately 12 years. Patient denies taking any medication or soaking her feet fo

## 2025-04-02 ENCOUNTER — Other Ambulatory Visit (HOSPITAL_BASED_OUTPATIENT_CLINIC_OR_DEPARTMENT_OTHER)
